# Patient Record
Sex: Male | Born: 1955 | Race: White | Hispanic: No | Marital: Married | Smoking: Never smoker
Health system: Southern US, Community
[De-identification: ages and names within clinical notes are randomized; demographics above are authoritative.]

## PROBLEM LIST (undated history)

## (undated) DIAGNOSIS — E7800 Pure hypercholesterolemia, unspecified: Secondary | ICD-10-CM

## (undated) DIAGNOSIS — J302 Other seasonal allergic rhinitis: Secondary | ICD-10-CM

---

## 2005-01-12 ENCOUNTER — Ambulatory Visit (HOSPITAL_COMMUNITY): Admission: RE | Admit: 2005-01-12 | Discharge: 2005-01-12 | Payer: Self-pay

## 2005-03-23 ENCOUNTER — Encounter (INDEPENDENT_AMBULATORY_CARE_PROVIDER_SITE_OTHER): Payer: Self-pay | Admitting: *Deleted

## 2005-03-23 ENCOUNTER — Ambulatory Visit (HOSPITAL_COMMUNITY): Admission: RE | Admit: 2005-03-23 | Discharge: 2005-03-23 | Payer: Self-pay | Admitting: Vascular Surgery

## 2005-03-23 IMAGING — CR DG CHEST 2V
2 series · 2 of 2 positions shown · non-contrast
Comparison: None.

CLINICAL DATA: 49-year-old with varicose veins.  Preop respiratory exam.  
 CHEST - 2 VIEW:

[view not recorded (1 of 2)]
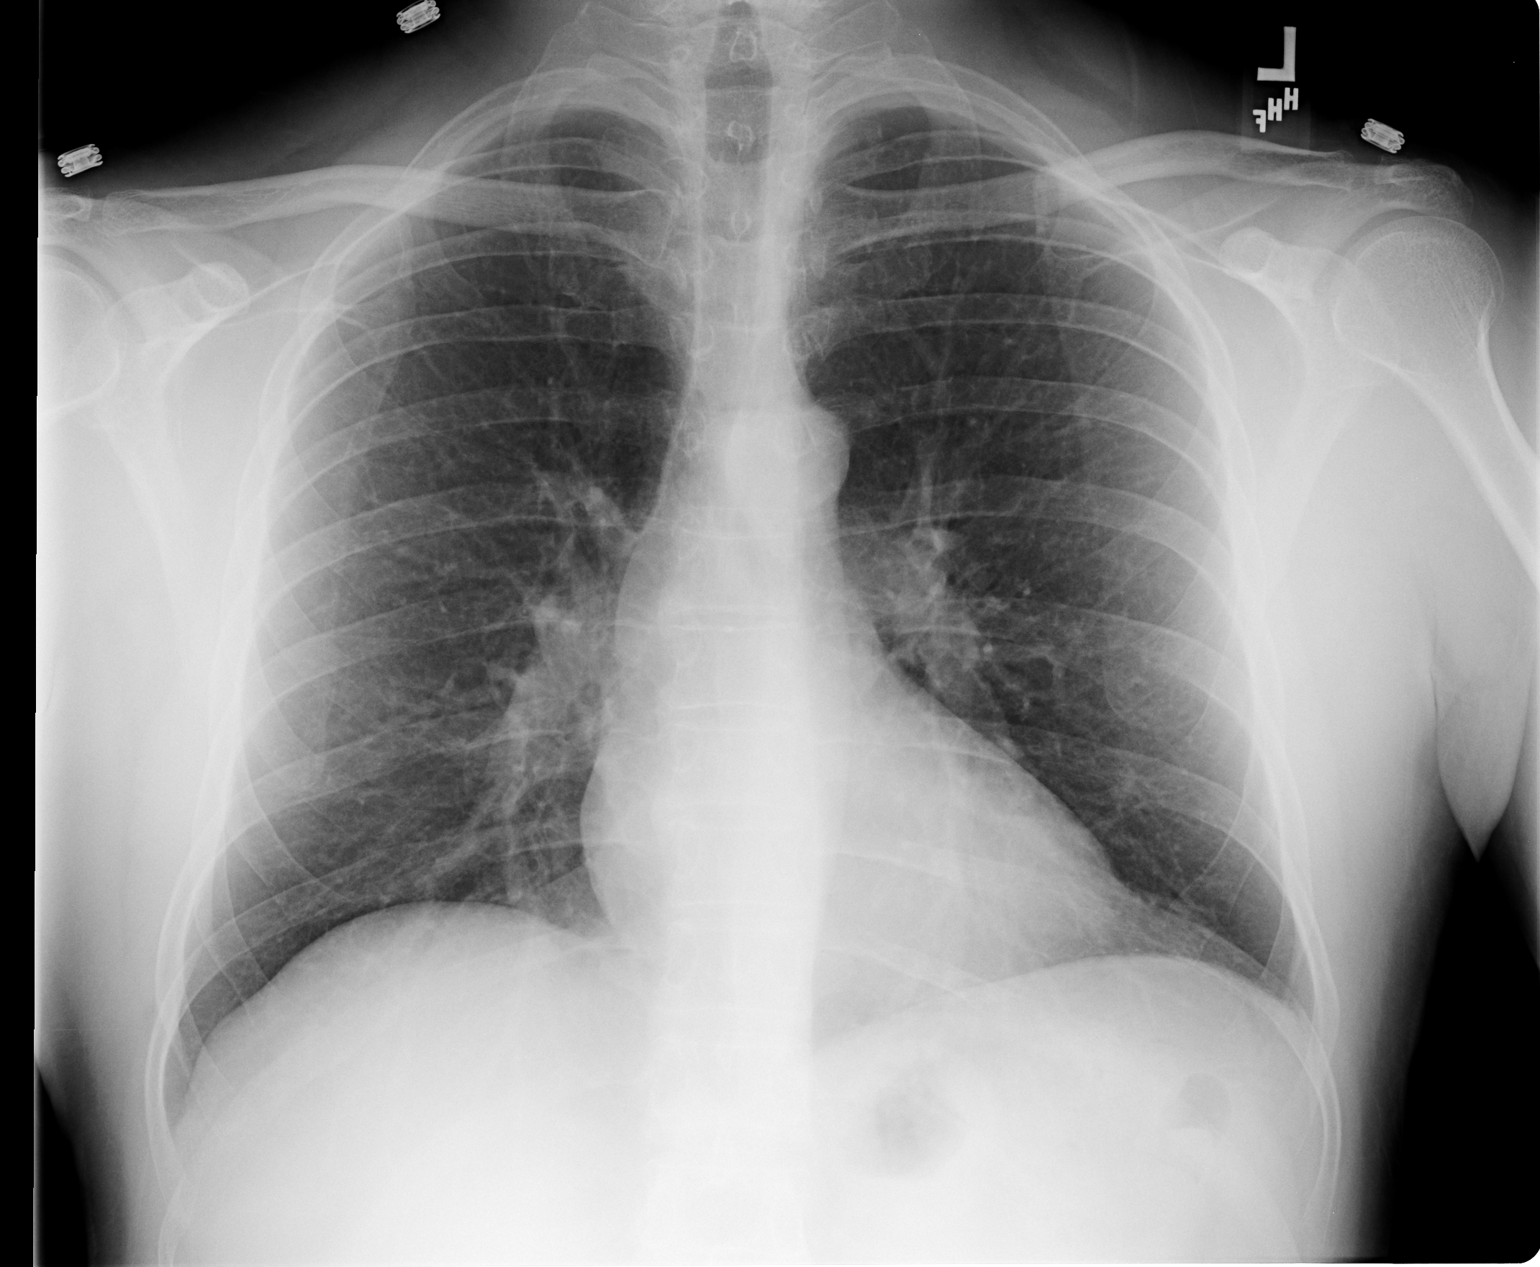

[view not recorded (2 of 2)]
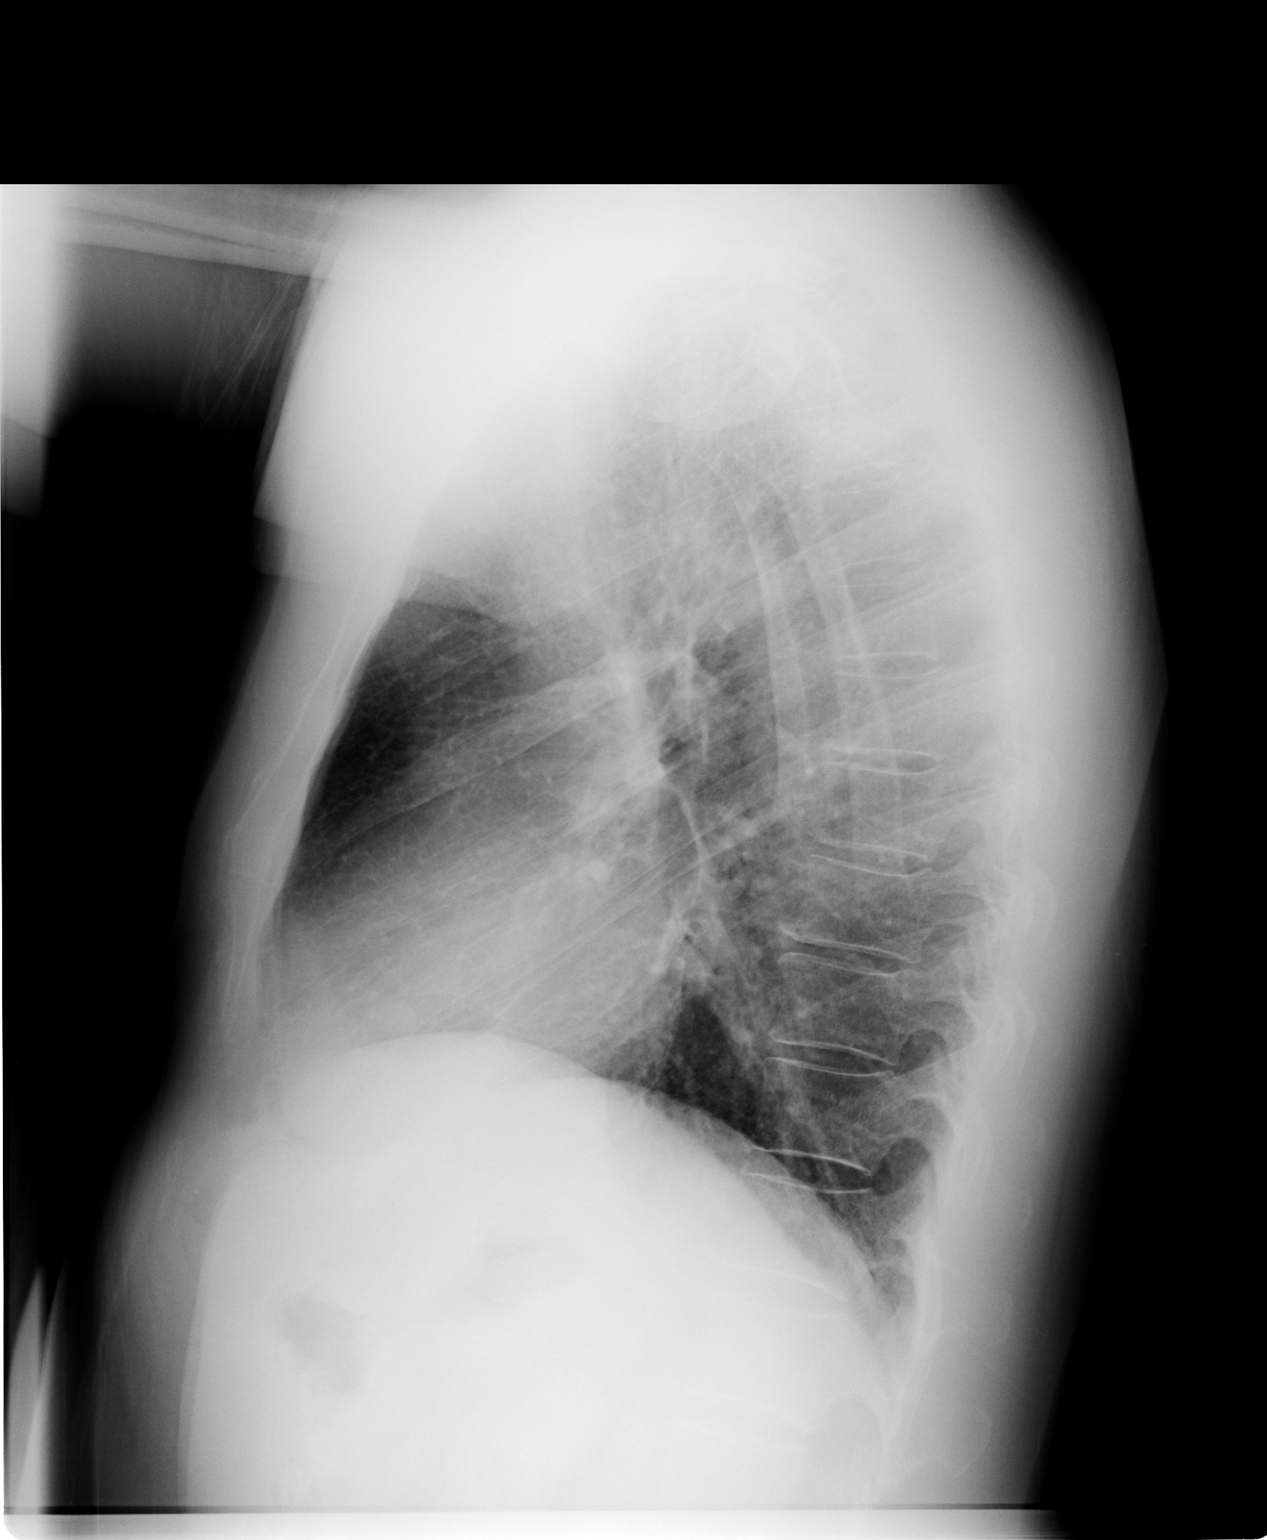

[2 of 2 positions shown; findings below may reference images not displayed]

The heart size and mediastinal contours are within normal limits.  Both lungs are clear.  The visualized skeletal structures are unremarkable.
IMPRESSION: No active cardiopulmonary disease.

## 2005-03-29 ENCOUNTER — Inpatient Hospital Stay (HOSPITAL_COMMUNITY): Admission: AD | Admit: 2005-03-29 | Discharge: 2005-03-30 | Payer: Self-pay | Admitting: Vascular Surgery

## 2005-03-30 IMAGING — CT CT ANGIO CHEST
1 of 3 series · 19 of 32 positions shown · IV contrast (100 ML OMNI 300)
Comparison: None.

CLINICAL DATA: Chest pain/short of breath/recent vein surgery.
 CT ANGIOGRAPHY OF CHEST WITH CONTRAST:
TECHNIQUE: Multidetector CT imaging of the chest was performed before and during bolus injection of intravenous contrast.  Multiplanar CT angiographic image reconstructions were generated to evaluate the vascular anatomy.
 Contrast:  100 cc of Omnipaque 300 IV.

[Series 2: pe · axial · 0.68mm/px · z∈[-278,-16]mm · 19 of 468 slices shown]
[im 24/468  lung]
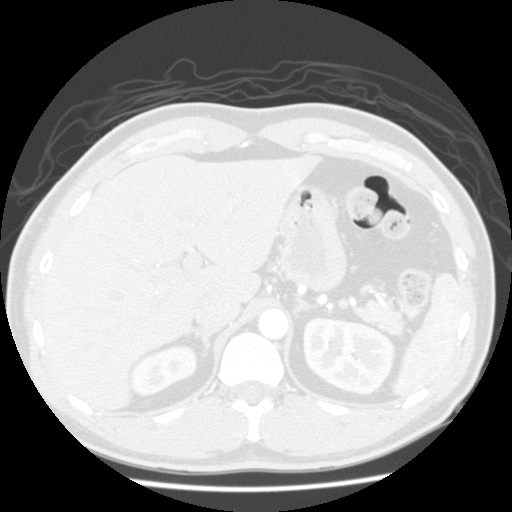
[im 47/468  mediastinal]
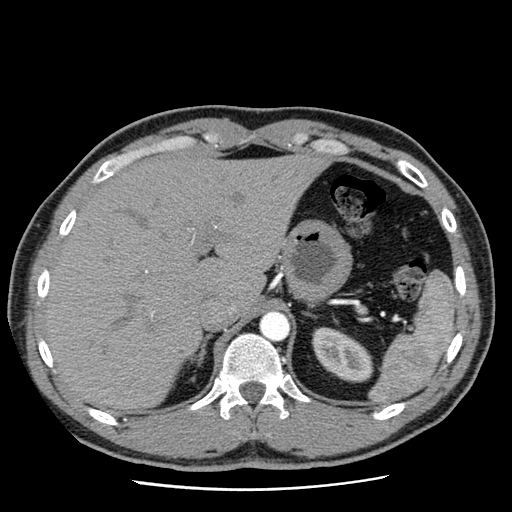
[im 71/468  lung]
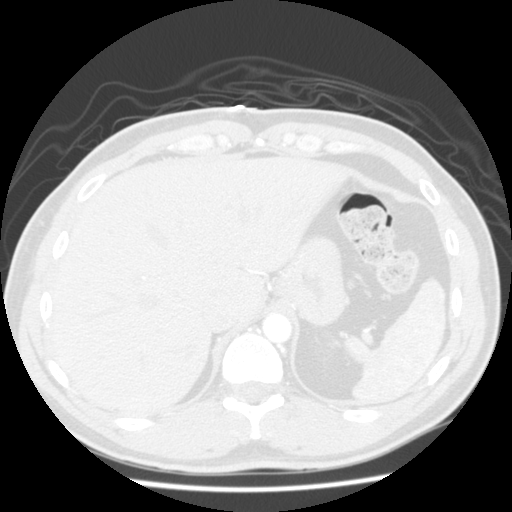
[im 117/468  mediastinal]
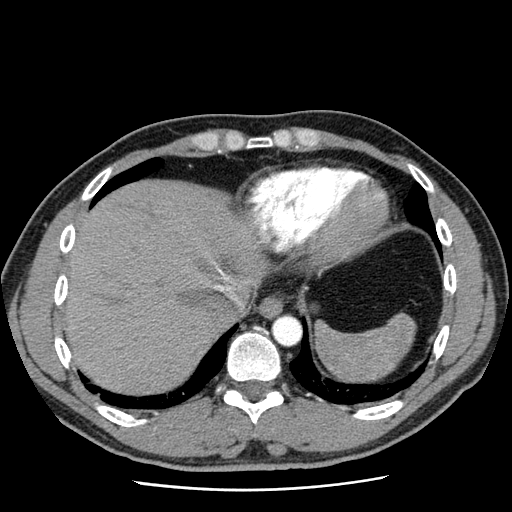
[im 141/468  lung]
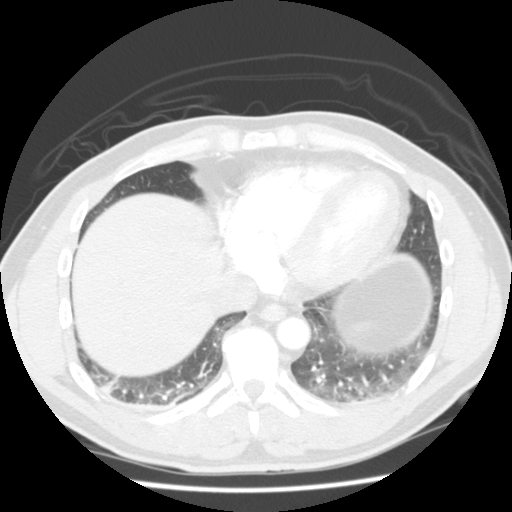
[im 156/468  mediastinal]
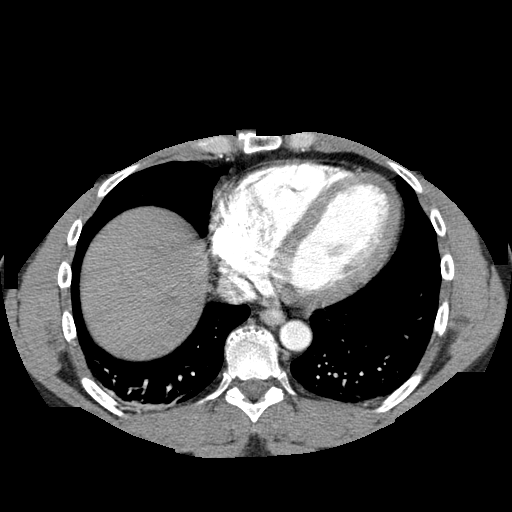
[im 164/468  lung]
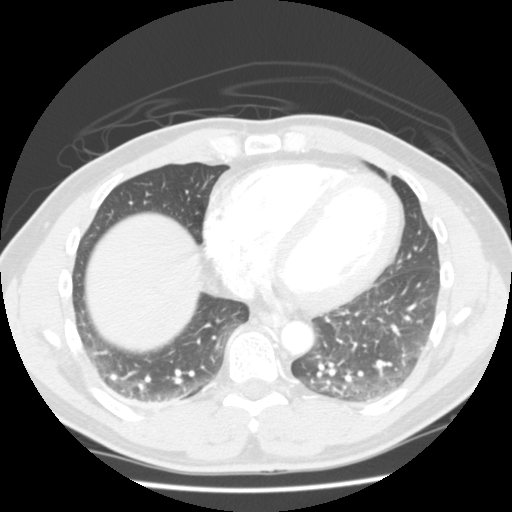
[im 187/468  mediastinal]
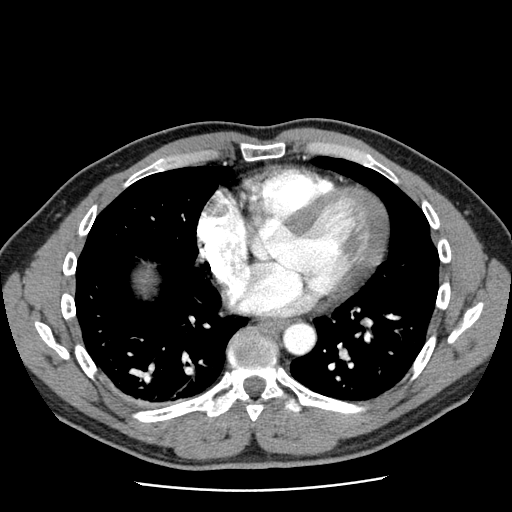
[im 211/468  lung]
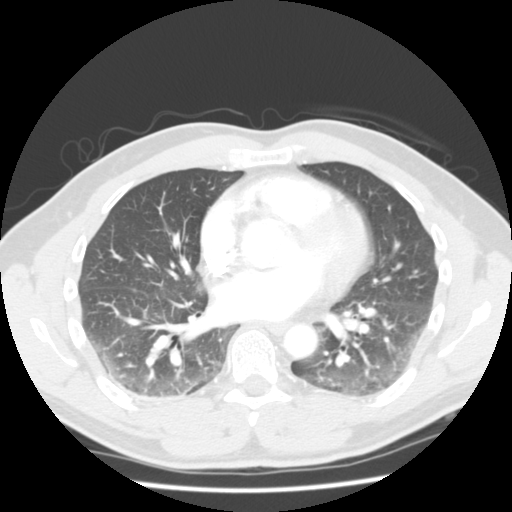
[im 234/468  mediastinal]
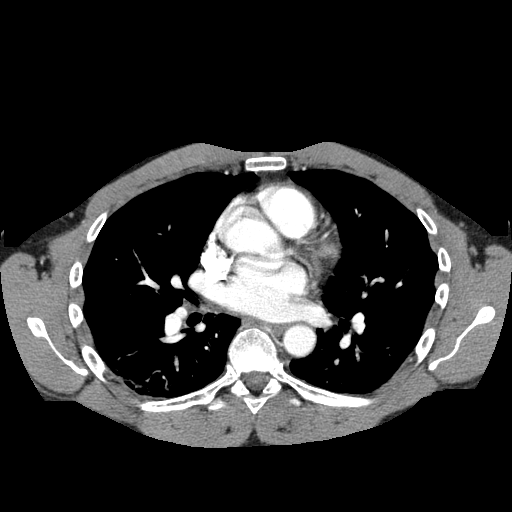
[im 257/468  lung]
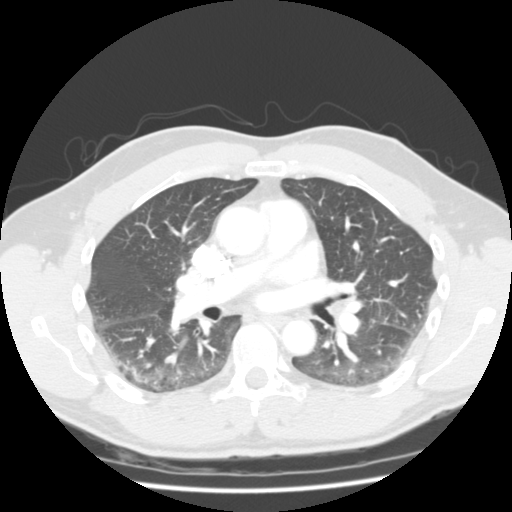
[im 281/468  mediastinal]
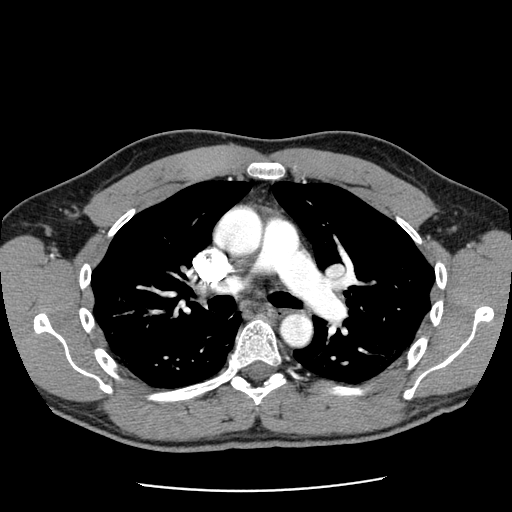
[im 304/468  lung]
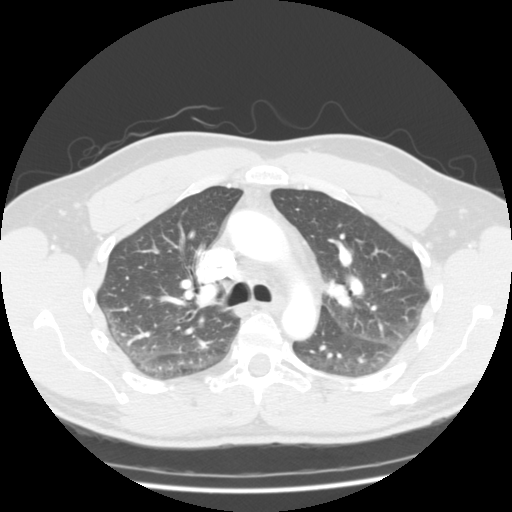
[im 312/468  mediastinal]
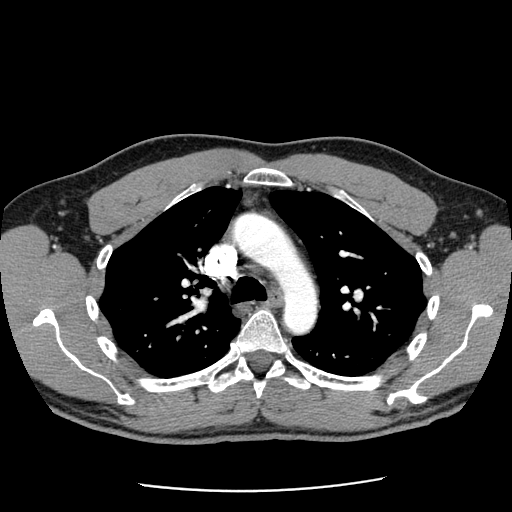
[im 327/468  lung]
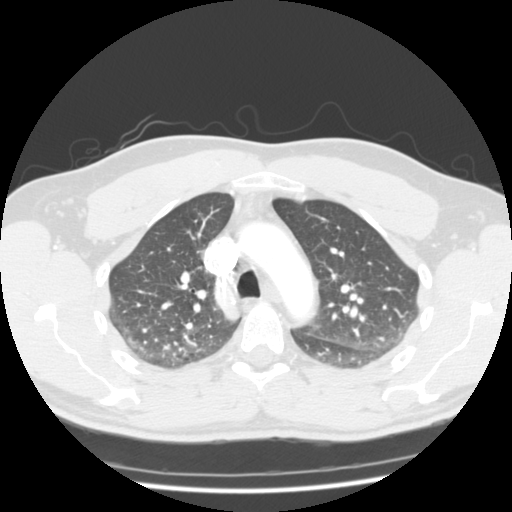
[im 351/468  mediastinal]
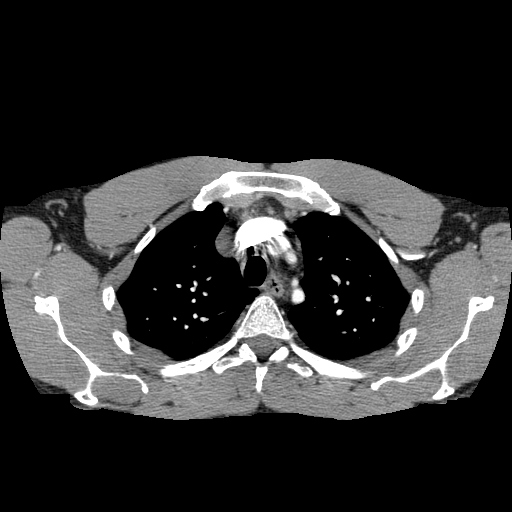
[im 397/468  lung]
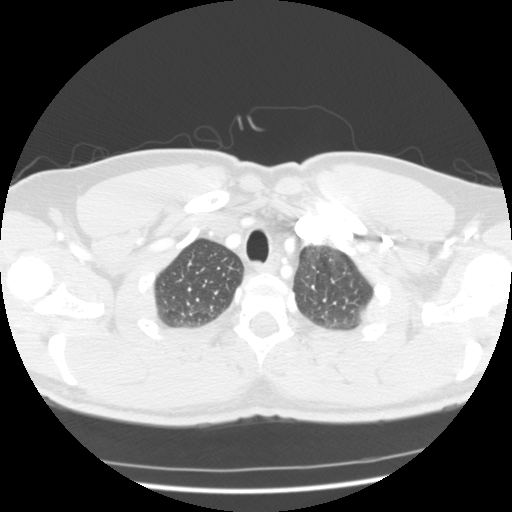
[im 421/468  mediastinal]
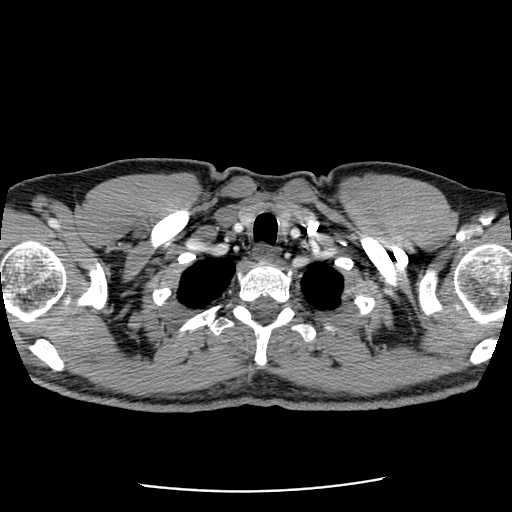
[im 444/468  lung]
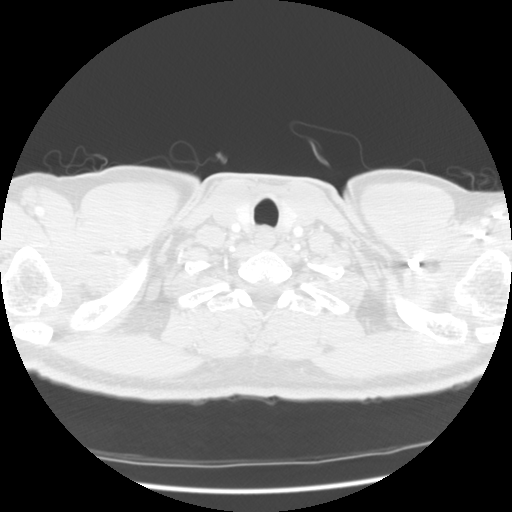

[19 of 32 positions shown; findings below may reference images not displayed]

FINDINGS: In three planes, no definite pulmonary emboli are identified.  The lungs show some scattered blebs and some minimal dependent edema posteriorly, but no active pneumonia, pneumothorax, or nodules.  No adenopathy.  No pleural or pericardial fluid.  There is no evidence for aortic dissection or fusiform aneurysm.
IMPRESSION: 1.  No evidence for pulmonary emboli.
 2.  No active cardiopulmonary disease.
 3.  Aorta normal.

## 2008-10-02 ENCOUNTER — Encounter: Admission: RE | Admit: 2008-10-02 | Discharge: 2008-10-02 | Payer: Self-pay | Admitting: Urology

## 2008-10-02 IMAGING — US US SCROTUM
1 series · 14 of 25 positions shown · non-contrast
Comparison: None available

CLINICAL DATA: Right testicular mass, the patient reports left
testicle larger than right since [DATE].  No discrete
palpable area.

ULTRASOUND OF SCROTUM
TECHNIQUE: Complete ultrasound examination of the testicles,
epididymis, and other scrotal structures was performed.

[Series 1: us scrotum · 0.08mm/px · 14 of 47 slices shown]
[im 1/47]
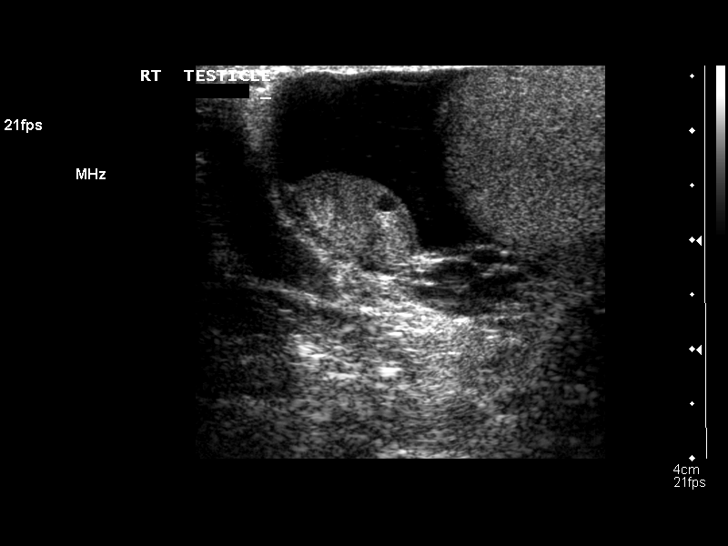
[im 4/47]
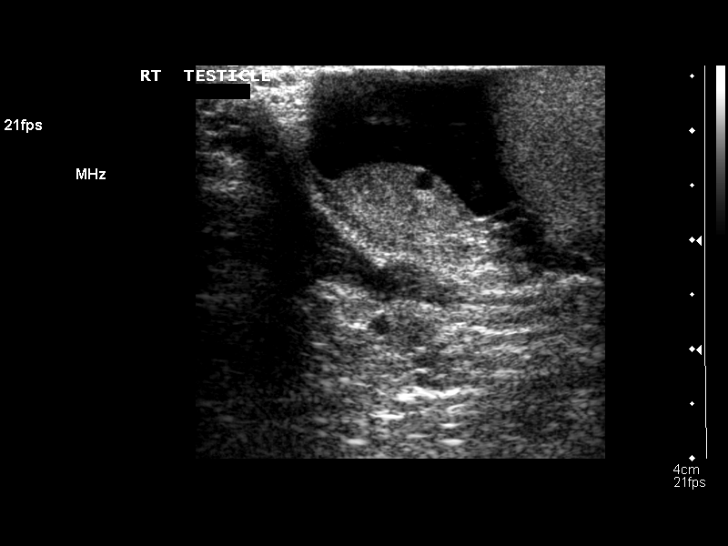
[im 8/47]
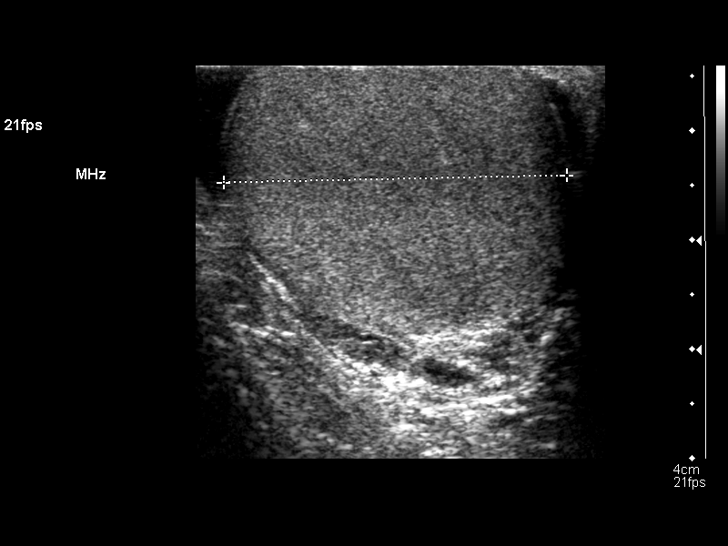
[im 12/47]
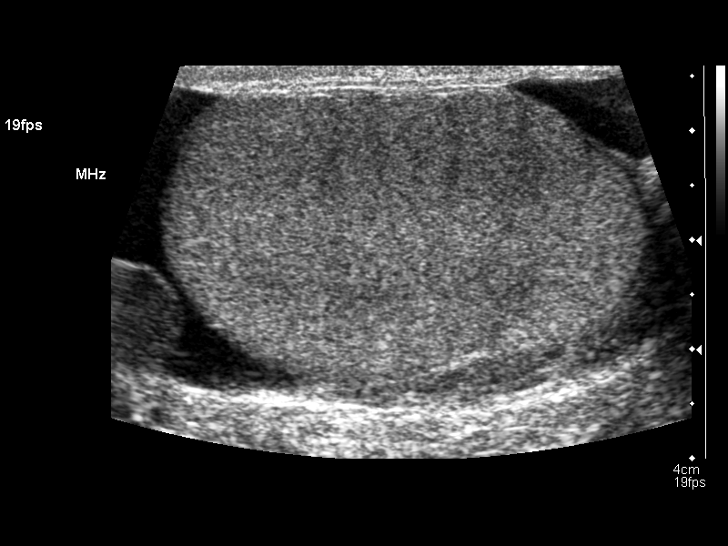
[im 16/47]
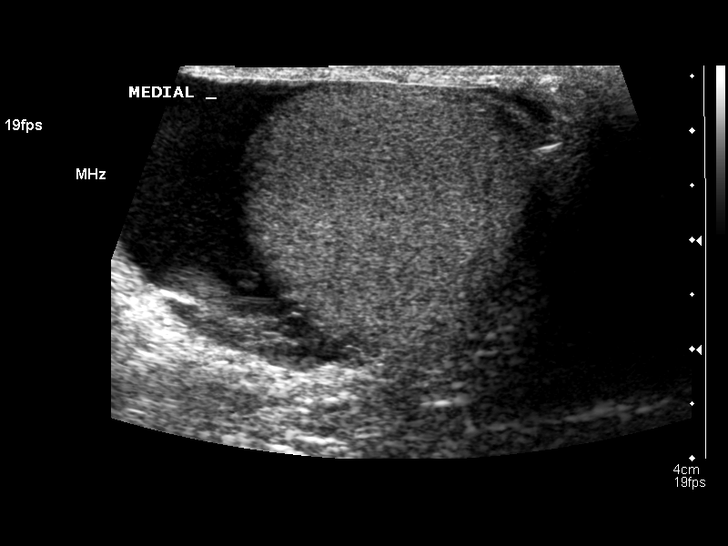
[im 18/47]
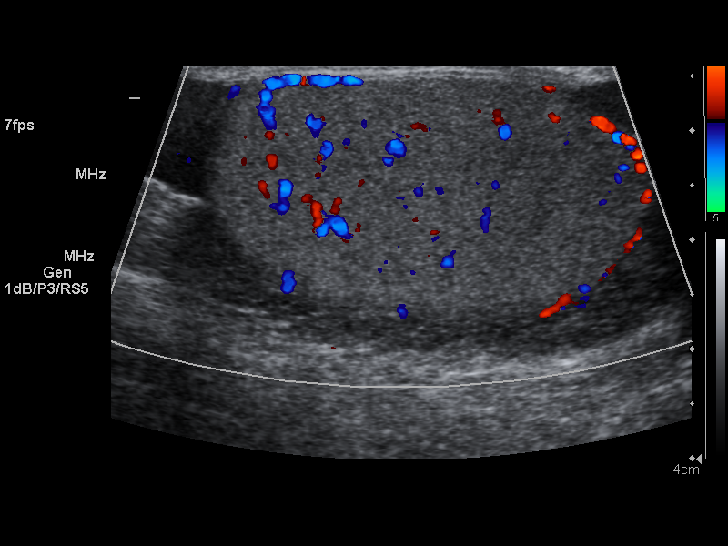
[im 22/47]
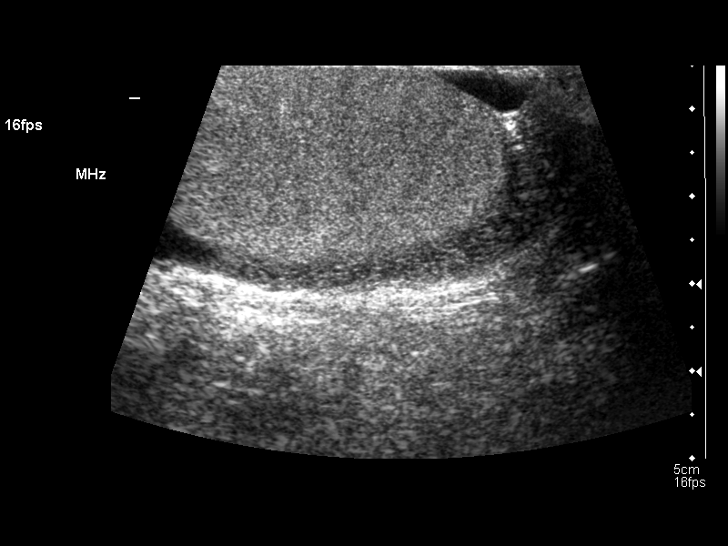
[im 25/47]
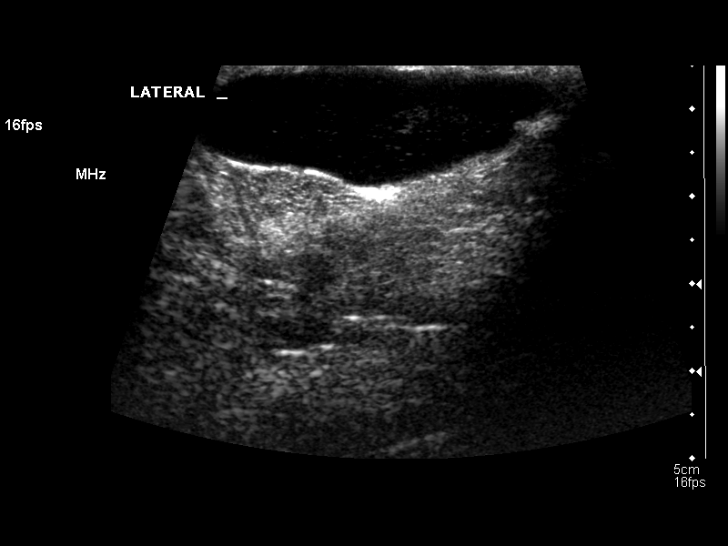
[im 29/47]
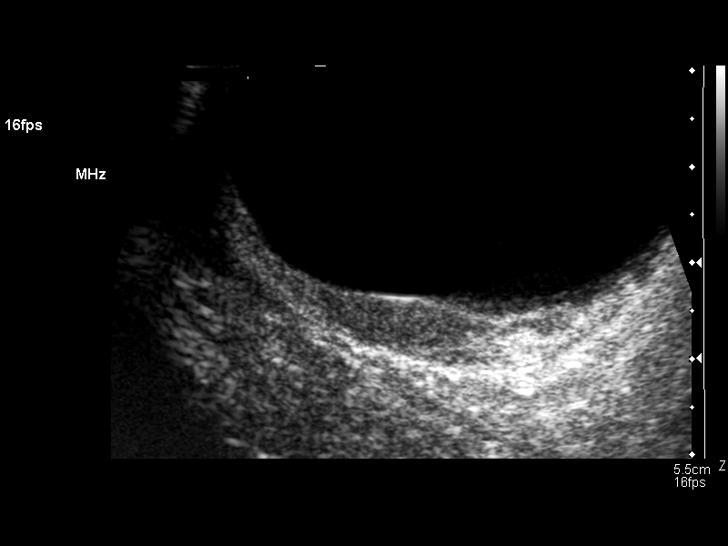
[im 31/47]
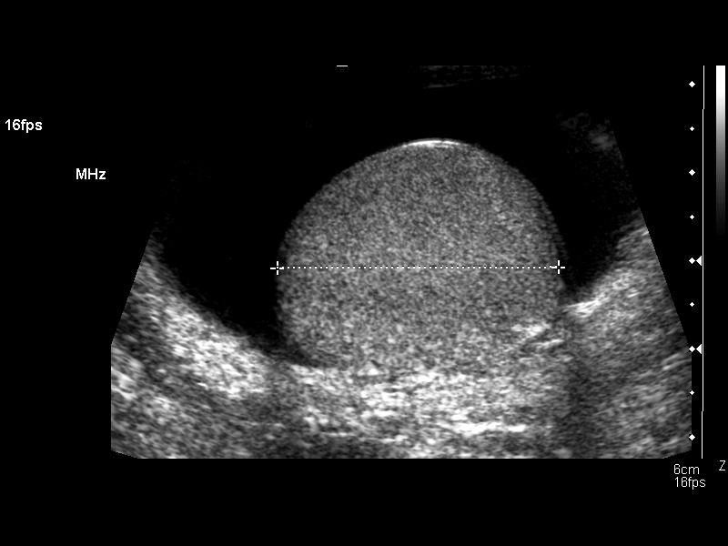
[im 35/47]
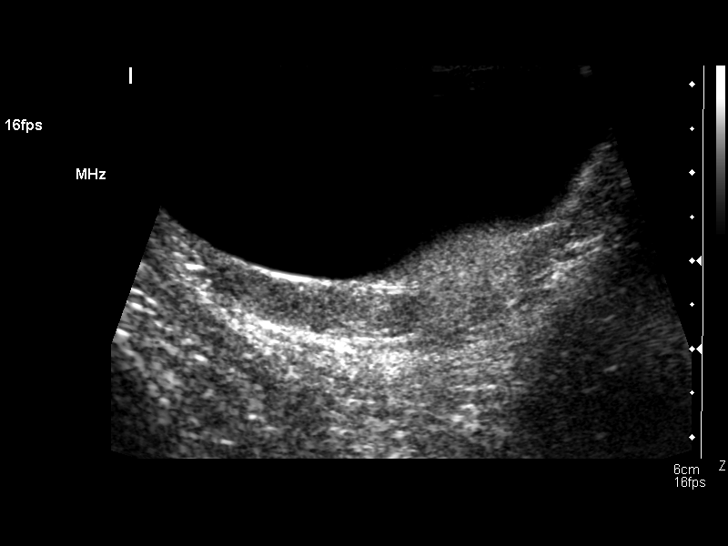
[im 39/47]
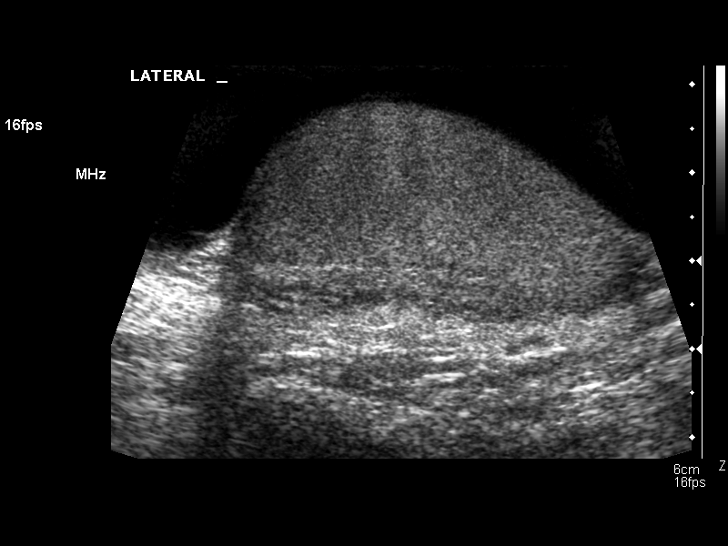
[im 43/47]
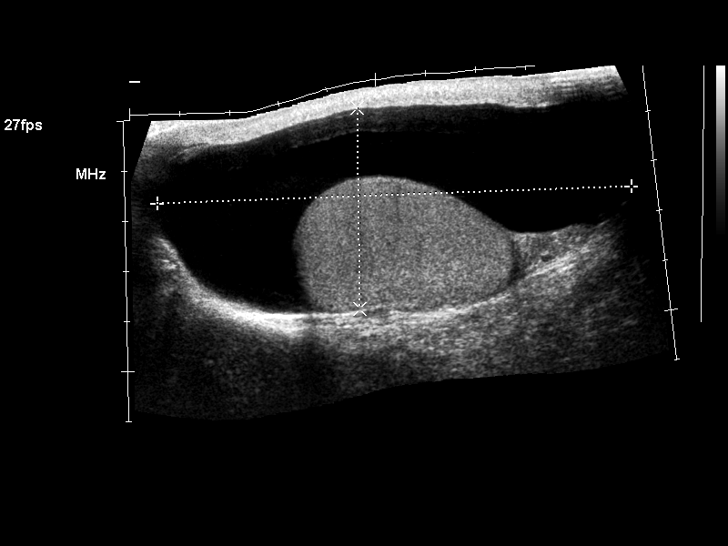
[im 47/47]
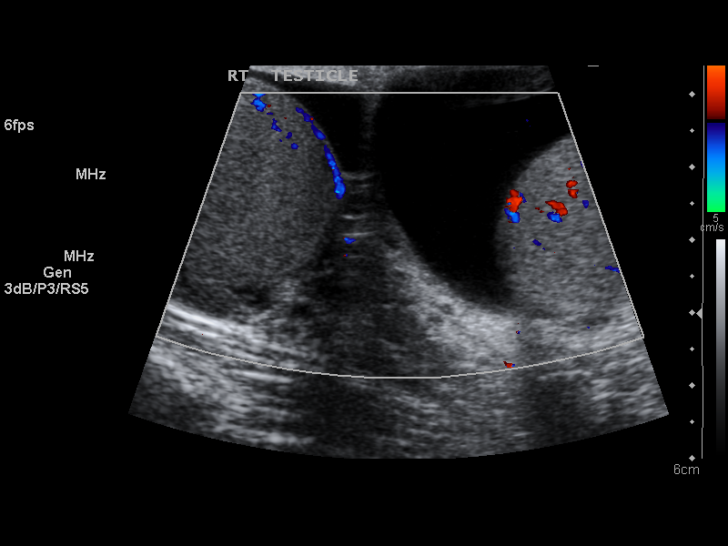

[14 of 25 positions shown; findings below may reference images not displayed]

FINDINGS: Right testicle:  The right testicle is normal in size and
homogeneous in echo texture measuring 4.5 x 2.6 x 3.1 cm.  There is
normal color Doppler flow.  The right epididymis appears normal.
There is a tiny 2 mm right epididymal cyst.

Left testicle is normal in size and homogeneous in echo texture
measuring 4.7 x 2.6 x 3.2 cm.  There is normal color Doppler flow.
The left epididymis is normal.

There are bilateral hydroceles, left greater than right.  These are
anechoic and appear simple.

No evidence of varicoceles.
IMPRESSION: 1.  Normal testicles.
2.  Bilateral simple hydroceles, left greater than right.

## 2008-12-19 ENCOUNTER — Ambulatory Visit (HOSPITAL_COMMUNITY): Admission: RE | Admit: 2008-12-19 | Discharge: 2008-12-19 | Payer: Self-pay | Admitting: Urology

## 2010-08-09 ENCOUNTER — Encounter: Payer: Self-pay | Admitting: Urology

## 2010-10-26 LAB — HEMOGLOBIN AND HEMATOCRIT, BLOOD
HCT: 48.5 % (ref 39.0–52.0)
Hemoglobin: 16.1 g/dL (ref 13.0–17.0)

## 2010-12-01 NOTE — Op Note (Signed)
NAMEDOYT, CASTELLANA NO.:  [id number]   MEDICAL RECORD NO.:  [id number]          PATIENT TYPE:  AMB   LOCATION:  DAY                          FACILITY:  [facility]   PHYSICIAN:  Mark C. Vernie Ammons, M.D.  DATE OF BIRTH:  03/30/1956   DATE OF PROCEDURE:  12/19/2008  DATE OF DISCHARGE:                               OPERATIVE REPORT   PREOPERATIVE DIAGNOSIS:  Bilateral hydroceles (left greater than right).   POSTOPERATIVE DIAGNOSIS:  Bilateral hydroceles (left greater than  right).   PROCEDURE:  Bilateral hydrocelectomy.   SURGEON:  Dr. Vernie Ammons.   ANESTHESIA:  General with local supplement.   SPECIMENS:  None.   BLOOD LOSS:  Minimal.   DRAINS:  None.   COMPLICATIONS:  None.   INDICATIONS:  The patient is a 55 year old male with a large, tense left  hydrocele that has increased in size and has been causing pain.  A  scrotal ultrasound was performed that revealed bilateral hydroceles with  the left side being much larger than the right and no testicular  pathology.  The procedure of hydrocelectomy was discussed with the  patient, as well as the alternatives, risks and complications.  The  patient understands and has elected to proceed.  He also desired  treatment of the right sided hydrocele simultaneously to prevent this  from occurring on the right-hand side in the future.   DESCRIPTION OF OPERATION:  After informed consent, the patient was  brought to the major OR, placed on table and administered general  anesthesia.  Then, his genitalia were sterilely prepped and draped.  An  official timeout was then performed and a midline median raphe scrotal  incision was then made and carried down through the scrotal tissue to  expose the parietal tunica albuginea which was incised, drained of clear  fluid and then dissected from the surrounding tissue.  I then incised  this along its length and exposed the testicle which was noted to be  mildly hyperemic but had no  lesions.  The epididymis also appeared to  have mild to moderate hyperemia, but again no specific masses or lesions  were identified and the hydrocele fluid that was drained was completely  clear amber.   I excised the redundant the parietal tunica vaginalis and then  cauterized the edges circumferentially.  I then reapproximated the edges  behind the testicle and epididymis using running locking 3-0 chromic  suture.  This was performed from distal to proximal and I left enough  space to freely allow the cord and epididymis to be free of any pressure  or tension.  I then replaced the left testicle in the left hemiscrotum  and then directed my attention to the right hemiscrotum through the same  incision.  I then opened the parietal tunica in an identical fashion,  drained clear amber hydrocele fluid and because the sac was not  particularly large, I reapproximated the edges behind the testicle and  epididymis in an identical fashion to the left side.  Again, no tension  or pressure was placed on the epididymis or  cord structures at the  posterior of the testicle.  I then replaced the right testicle in the  right hemiscrotum and inspected for any bleeding points.  Small bleeding  points were identified and cauterized with electrocautery.  I then  closed the deep tissue in a running fashion using 4-0 chromic suture  incorporating the septum.  I then injected quarter-percent plain  Marcaine in the subcutaneous tissue and closed the skin with running 4-0  chromic.  Fluffed Kerlix and a scrotal support was applied and the  patient was taken to the recovery room where ice was applied to the  scrotum and he was allowed to recover.   He was given discharge instructions and a prescription for specimen for  Keflex 500 mg t.i.d. #15 and Demerol tabs 50 mg #28 with follow-up in my  office on January 16, 2009.      Mark C. Vernie Ammons, M.D.  Electronically Signed     MCO/MEDQ  D:  12/19/2008  T:   12/19/2008  Job:  [id number]

## 2010-12-04 NOTE — Op Note (Signed)
[name], Bradley Marquez                   ACCOUNT NO.:  [id number]   MEDICAL RECORD NO.:  [id number]          PATIENT TYPE:  AMB   LOCATION:  SDS                          FACILITY:  [facility]   PHYSICIAN:  Larina Earthly, M.D.    DATE OF BIRTH:  [birth year]   DATE OF PROCEDURE:  DATE OF DISCHARGE:                                 OPERATIVE REPORT   PREOPERATIVE DIAGNOSIS:  Severe venous insufficiency of right greater  saphenous vein and marked tributary varicosities with changes of venous  stasis to right ankle.   POSTOPERATIVE DIAGNOSIS:  Severe venous insufficiency of right greater  saphenous vein and marked tributary varicosities with changes of venous  stasis to right ankle.   PROCEDURE:  Ligation and stripping of right greater saphenous vein from the  ankle to the groin with tributary varicosity removal of varicosities in the  thigh, calf and ankle.   SURGEON:  Larina Earthly, M.D.   ASSISTANT:  Nurse.   ANESTHESIA:  General endotracheal anesthesia.   COMPLICATIONS:  None.   DISPOSITION:  To recovery room stable.   PROCEDURE IN DETAIL:  The patient was taken to the operating room and placed  in the supine position where the area of the right groin and right leg were  prepped and draped in the usual sterile fashion.  The patient had been  placed in the standing position prior to this and the varicosities were all  marked.  Incision was made at the groin and carried down to isolate the  saphenous vein at the saphenofemoral junction.  The patient had a very large  saphenous vein which was 2 cm in diameter at the saphenofemoral femoral  junction.  Tributary branches of this were ligated and divided.   Separate incision was made at the ankle and the saphenous vein was exposed  through a small incision and was ligated distally.  The vein was controlled  proximally with the Potts tie, and was opened with an 11-blade and a  stripper was passed centrally.  The stripper was held up in a very  large  nest of varicosities in the medial thigh.  A separate incision was made over  this area and the stripper was directly pass through this site up to the  groin.  The saphenous vein was doubly ligated at the saphenofemoral junction  with 2-0 silk ties. The vein was controlled distally with Potts tie and  stripper was brought out through the vein near the saphenofemoral junction.   Next, using a stab-avulsion technique tributary varicosities were removed  from the calf and from the ankle.  Also, the patient had a very large nest  of varicosities in the medial thigh and these were all removed with  avulsion.  Finally the patient was placed in Trendelenburg position and the  saphenous vein was removed with the vein stripper and pressure was held for  hemostasis.  The stab incisions were closed with 4-0 subcuticular Vicryl  stitches.  The  ankle incision was closed with interrupted 4-0 subcuticular Vicryl stitches  and the groin incision was closed  with 3-0 Vicryl in the subcutaneous  tissue, subcuticular tissue. Benzoin and Steri-Strips were applied.  A  Kerlix and Coban pressure dressing was applied.  The patient was transferred  to the recovery room in stable condition.      Larina Earthly, M.D.  Electronically Signed     TFE/MEDQ  D:  03/23/2005  T:  03/23/2005  Job:  [id number]

## 2020-04-17 ENCOUNTER — Observation Stay (HOSPITAL_COMMUNITY): Payer: No Typology Code available for payment source

## 2020-04-17 ENCOUNTER — Other Ambulatory Visit: Payer: Self-pay

## 2020-04-17 ENCOUNTER — Emergency Department (HOSPITAL_COMMUNITY): Payer: No Typology Code available for payment source

## 2020-04-17 ENCOUNTER — Encounter (HOSPITAL_COMMUNITY): Payer: Self-pay | Admitting: *Deleted

## 2020-04-17 ENCOUNTER — Observation Stay (HOSPITAL_COMMUNITY)
Admission: EM | Admit: 2020-04-17 | Discharge: 2020-04-18 | Disposition: A | Discharge status: Home / Self Care | Payer: No Typology Code available for payment source | Attending: Family Medicine | Admitting: Family Medicine

## 2020-04-17 DIAGNOSIS — I10 Essential (primary) hypertension: Secondary | ICD-10-CM | POA: Diagnosis not present

## 2020-04-17 DIAGNOSIS — Z23 Encounter for immunization: Secondary | ICD-10-CM | POA: Insufficient documentation

## 2020-04-17 DIAGNOSIS — R413 Other amnesia: Secondary | ICD-10-CM | POA: Diagnosis present

## 2020-04-17 DIAGNOSIS — G9341 Metabolic encephalopathy: Secondary | ICD-10-CM

## 2020-04-17 DIAGNOSIS — E782 Mixed hyperlipidemia: Secondary | ICD-10-CM | POA: Insufficient documentation

## 2020-04-17 DIAGNOSIS — F329 Major depressive disorder, single episode, unspecified: Secondary | ICD-10-CM | POA: Insufficient documentation

## 2020-04-17 DIAGNOSIS — R401 Stupor: Secondary | ICD-10-CM | POA: Diagnosis not present

## 2020-04-17 DIAGNOSIS — Z79899 Other long term (current) drug therapy: Secondary | ICD-10-CM | POA: Diagnosis not present

## 2020-04-17 DIAGNOSIS — Z7951 Long term (current) use of inhaled steroids: Secondary | ICD-10-CM | POA: Insufficient documentation

## 2020-04-17 DIAGNOSIS — R011 Cardiac murmur, unspecified: Secondary | ICD-10-CM | POA: Diagnosis not present

## 2020-04-17 DIAGNOSIS — G454 Transient global amnesia: Principal | ICD-10-CM | POA: Insufficient documentation

## 2020-04-17 DIAGNOSIS — Z20822 Contact with and (suspected) exposure to covid-19: Secondary | ICD-10-CM | POA: Insufficient documentation

## 2020-04-17 DIAGNOSIS — R410 Disorientation, unspecified: Secondary | ICD-10-CM | POA: Diagnosis present

## 2020-04-17 HISTORY — DX: Other seasonal allergic rhinitis: J30.2

## 2020-04-17 HISTORY — DX: Pure hypercholesterolemia, unspecified: E78.00

## 2020-04-17 LAB — COMPREHENSIVE METABOLIC PANEL
ALT: 44 U/L (ref 0–44)
AST: 33 U/L (ref 15–41)
Albumin: 4.5 g/dL (ref 3.5–5.0)
Alkaline Phosphatase: 98 U/L (ref 38–126)
Anion gap: 11 (ref 5–15)
BUN: 11 mg/dL (ref 8–23)
CO2: 26 mmol/L (ref 22–32)
Calcium: 9.7 mg/dL (ref 8.9–10.3)
Chloride: 103 mmol/L (ref 98–111)
Creatinine, Ser: 0.75 mg/dL (ref 0.61–1.24)
GFR calc Af Amer: >60 mL/min (ref >60)
GFR calc non Af Amer: >60 mL/min (ref >60)
Glucose, Bld: 109 mg/dL — ABNORMAL HIGH (ref 70–99)
Potassium: 3.8 mmol/L (ref 3.5–5.1)
Sodium: 140 mmol/L (ref 135–145)
Total Bilirubin: 0.9 mg/dL (ref 0.3–1.2)
Total Protein: 7.5 g/dL (ref 6.5–8.1)

## 2020-04-17 LAB — CBC
HCT: 51.4 % (ref 39.0–52.0)
Hemoglobin: 16.9 g/dL (ref 13.0–17.0)
MCH: 32.8 pg (ref 26.0–34.0)
MCHC: 32.9 g/dL (ref 30.0–36.0)
MCV: 99.6 fL (ref 80.0–100.0)
Platelets: 260 10*3/uL (ref 150–400)
RBC: 5.16 MIL/uL (ref 4.22–5.81)
RDW: 13.2 % (ref 11.5–15.5)
WBC: 7.3 10*3/uL (ref 4.0–10.5)
nRBC: 0 % (ref 0.0–0.2)

## 2020-04-17 LAB — RESPIRATORY PANEL BY RT PCR (FLU A&B, COVID)
Influenza A by PCR: NEGATIVE
Influenza B by PCR: NEGATIVE
SARS Coronavirus 2 by RT PCR: NEGATIVE

## 2020-04-17 LAB — RAPID URINE DRUG SCREEN, HOSP PERFORMED
Amphetamines: NOT DETECTED
Barbiturates: NOT DETECTED
Benzodiazepines: NOT DETECTED
Cocaine: NOT DETECTED
Opiates: NOT DETECTED
Tetrahydrocannabinol: NOT DETECTED

## 2020-04-17 LAB — TSH: TSH: 3.473 u[IU]/mL (ref 0.350–4.500)

## 2020-04-17 LAB — ACETAMINOPHEN LEVEL: Acetaminophen (Tylenol), Serum: <10 ug/mL — ABNORMAL LOW (ref 10–30)

## 2020-04-17 LAB — ETHANOL: Alcohol, Ethyl (B): <10 mg/dL (ref <10)

## 2020-04-17 LAB — DIFFERENTIAL
Abs Immature Granulocytes: 0.05 10*3/uL (ref 0.00–0.07)
Basophils Absolute: 0.1 10*3/uL (ref 0.0–0.1)
Basophils Relative: 1 %
Eosinophils Absolute: 0.2 10*3/uL (ref 0.0–0.5)
Eosinophils Relative: 3 %
Immature Granulocytes: 1 %
Lymphocytes Relative: 22 %
Lymphs Abs: 1.6 10*3/uL (ref 0.7–4.0)
Monocytes Absolute: 0.6 10*3/uL (ref 0.1–1.0)
Monocytes Relative: 8 %
Neutro Abs: 4.8 10*3/uL (ref 1.7–7.7)
Neutrophils Relative %: 65 %

## 2020-04-17 LAB — PROTIME-INR
INR: 0.9 (ref 0.8–1.2)
Prothrombin Time: 11.7 seconds (ref 11.4–15.2)

## 2020-04-17 LAB — SALICYLATE LEVEL: Salicylate Lvl: <7 mg/dL — ABNORMAL LOW (ref 7.0–30.0)

## 2020-04-17 LAB — APTT: aPTT: 27 seconds (ref 24–36)

## 2020-04-17 IMAGING — MR MR MRA NECK WO/W CM
8 of 11 series · 42 of 48 positions shown · IV contrast (Gadavist)
Comparison: Head CT [DATE]

CLINICAL DATA: Transient memory loss.

EXAM:
MR HEAD WITHOUT CONTRAST
MR CIRCLE OF WILLIS WITHOUT CONTRAST
MRA OF THE NECK WITHOUT AND WITH CONTRAST
TECHNIQUE: Multiplanar, multiecho pulse sequences of the brain, circle of
willis and surrounding structures were obtained without intravenous
contrast. Angiographic images of the neck were obtained using MRA
technique without and with intravenous contrast.
CONTRAST:  8.6mL GADAVIST GADOBUTROL 1 MMOL/ML IV SOLN

[Series 6: tof_fl3d_tra_iso · axial · 0.7mm · 0.52mm/px · z∈[-258,-109]mm · 9 of 214 slices shown]
[im 1/214]
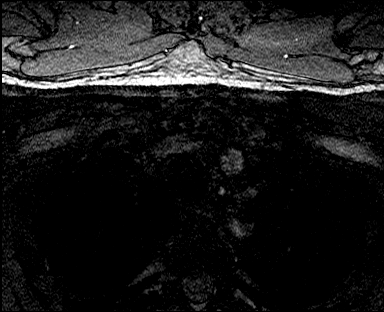
[im 39/214]
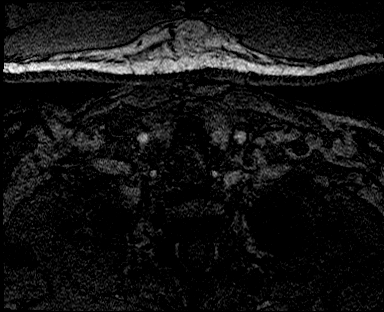
[im 59/214]
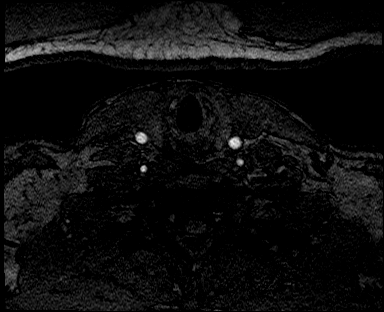
[im 97/214]
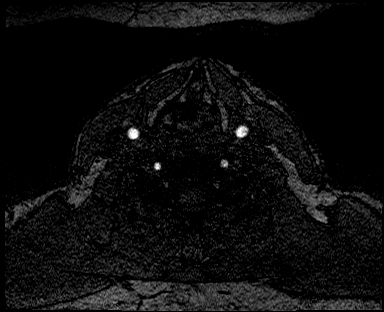
[im 117/214]
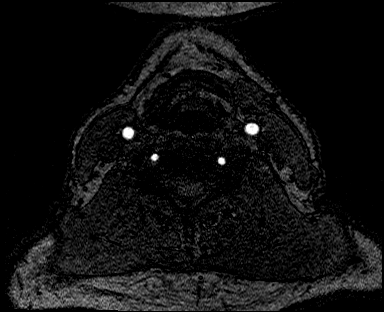
[im 155/214]
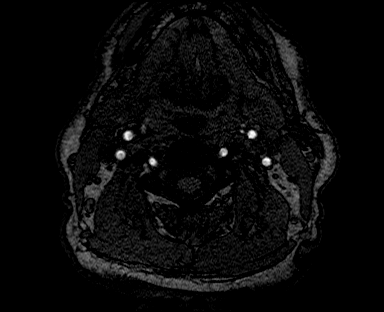
[im 175/214]
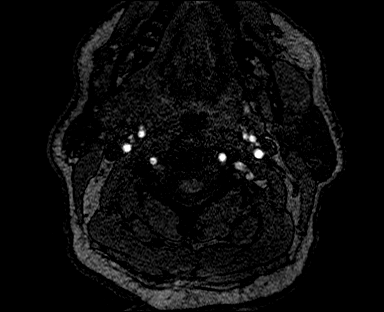
[im 194/214]
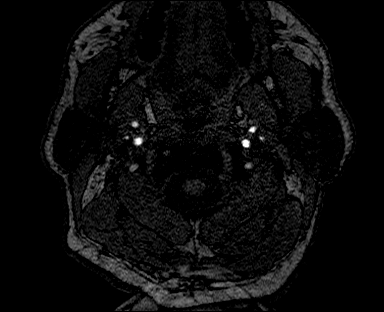
[im 214/214]
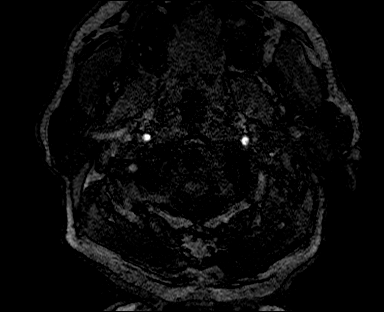

[Series 9: angio_fl3d_cor_pre_ttc=3.0s · coronal · 0.9mm · 0.85mm/px · 4 of 80 slices shown]
[im 1/80]
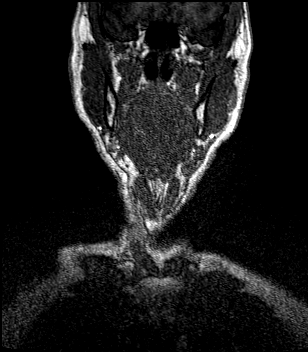
[im 27/80]
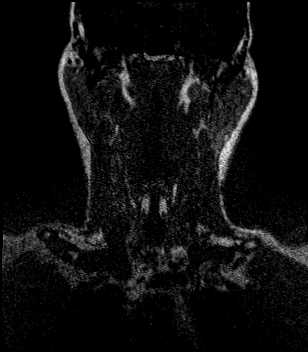
[im 53/80]
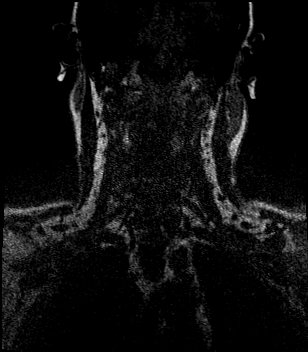
[im 80/80]
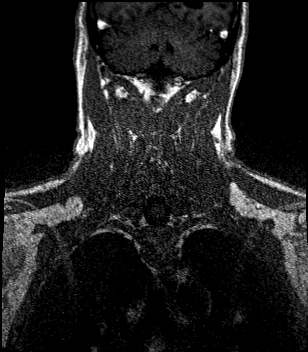

[Series 11: angio_fl3d_cor_post_ttc=3.0s · coronal · 0.9mm · 0.85mm/px · 4 of 80 slices shown (1 of 2)]
[im 1/80]
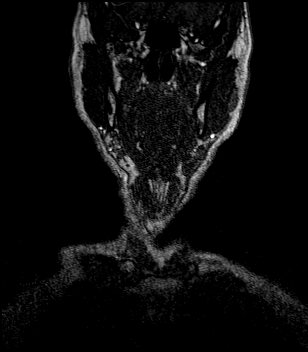
[im 27/80]
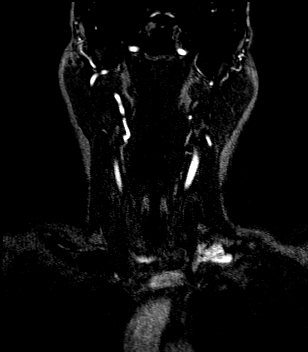
[im 53/80]
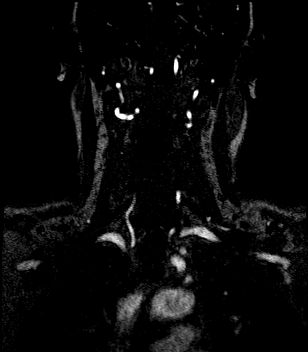
[im 80/80]
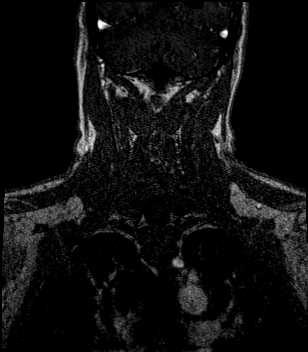

[Series 12: angio_fl3d_cor_post_ttc=3.0s_moco-adv · coronal · 0.9mm · 0.85mm/px · 5 of 79 slices shown (1 of 2)]
[im 1/79]
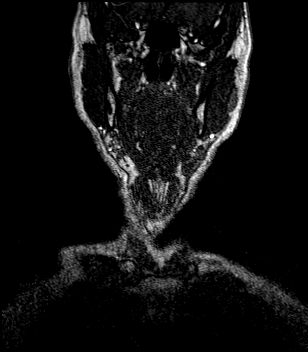
[im 20/79]
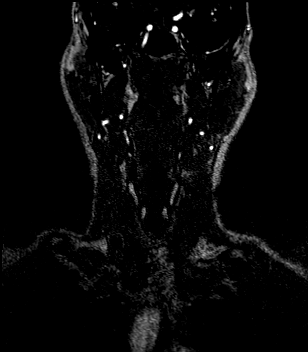
[im 40/79]
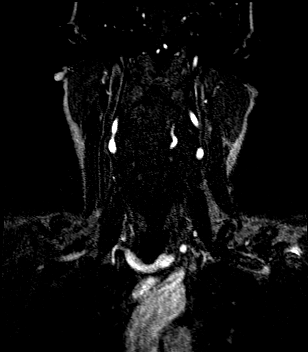
[im 59/79]
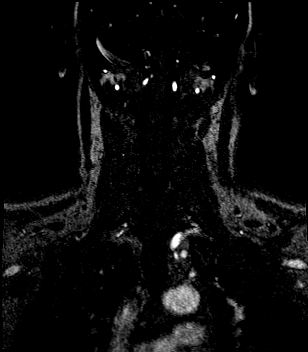
[im 79/79]
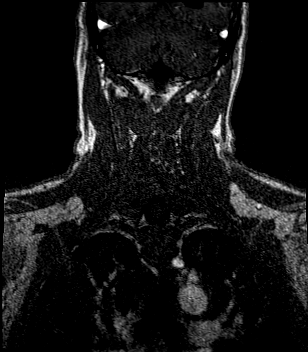

[Series 13: angio_fl3d_cor_post_ttc=3.0s_moco-adv_sub · coronal · 0.9mm · 0.85mm/px · 5 of 80 slices shown (1 of 2)]
[im 1/80]
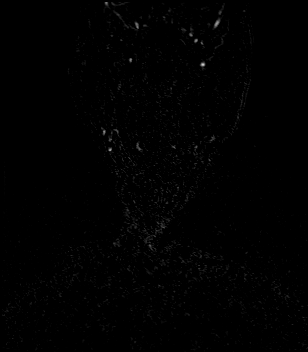
[im 20/80]
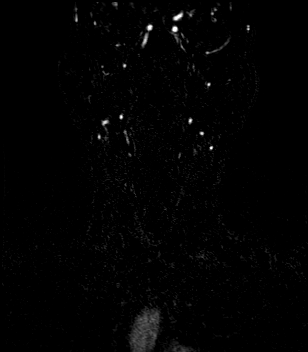
[im 40/80]
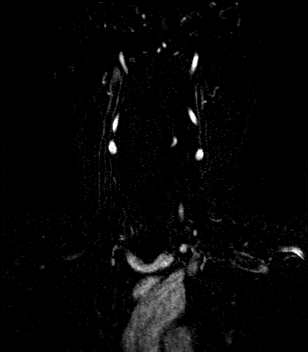
[im 60/80]
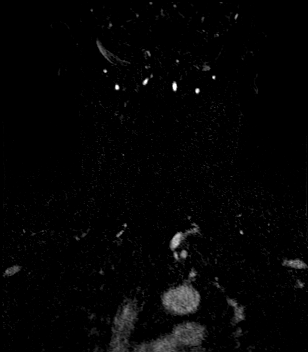
[im 80/80]
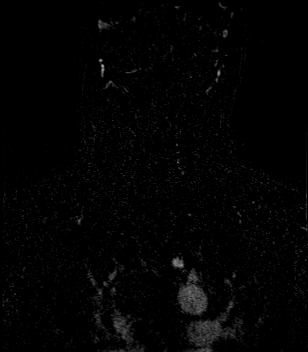

[Series 15: angio_fl3d_cor_post_ttc=3.0s · coronal · 0.9mm · 0.85mm/px · 5 of 80 slices shown (2 of 2)]
[im 1/80]
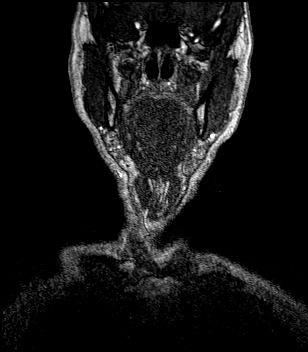
[im 20/80]
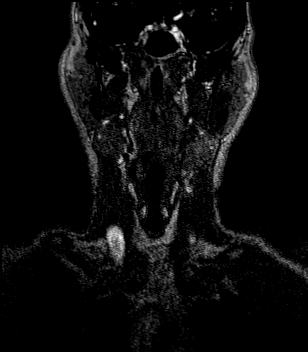
[im 40/80]
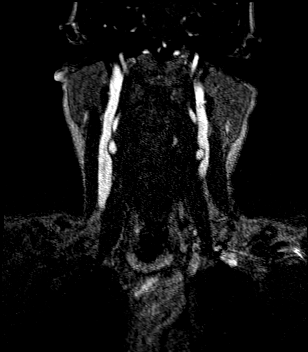
[im 60/80]
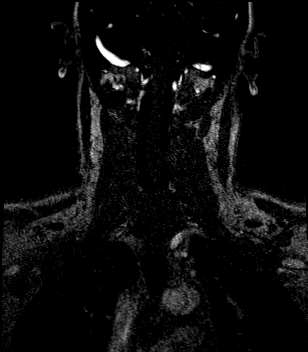
[im 80/80]
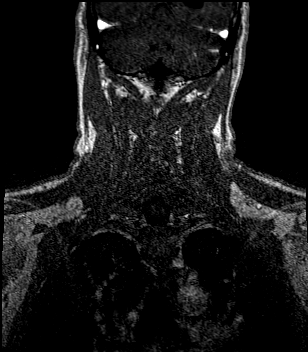

[Series 16: angio_fl3d_cor_post_ttc=3.0s_moco-adv · coronal · 0.9mm · 0.85mm/px · 5 of 80 slices shown (2 of 2)]
[im 1/80]
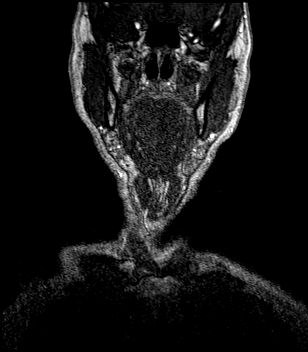
[im 20/80]
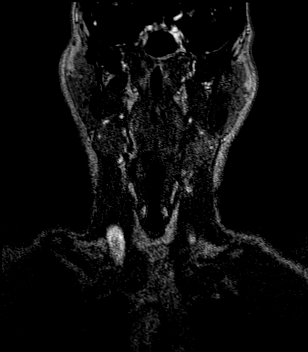
[im 40/80]
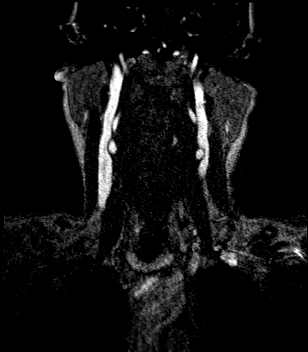
[im 60/80]
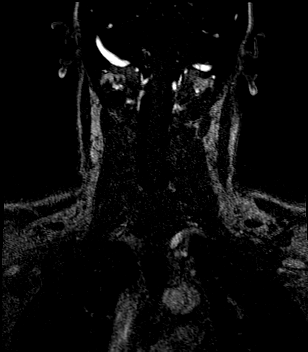
[im 80/80]
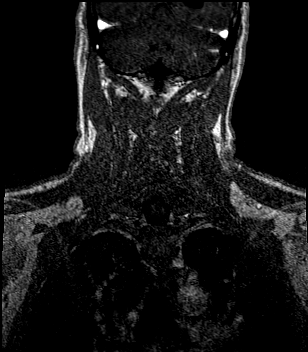

[Series 17: angio_fl3d_cor_post_ttc=3.0s_moco-adv_sub · coronal · 0.9mm · 0.85mm/px · 5 of 80 slices shown (2 of 2)]
[im 1/80]
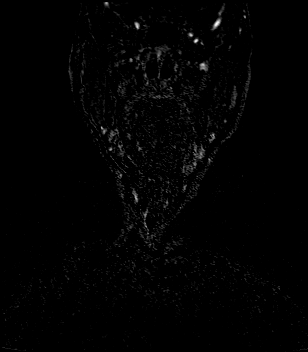
[im 20/80]
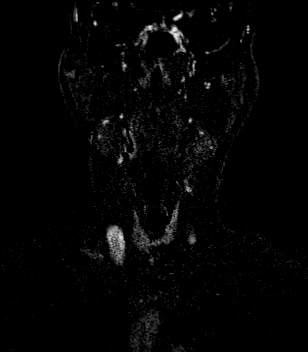
[im 40/80]
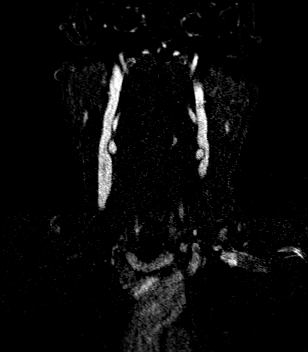
[im 60/80]
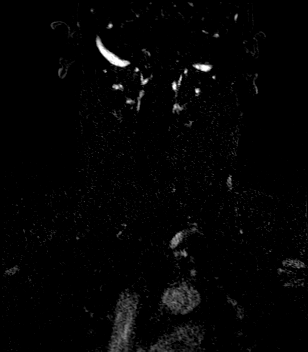
[im 80/80]
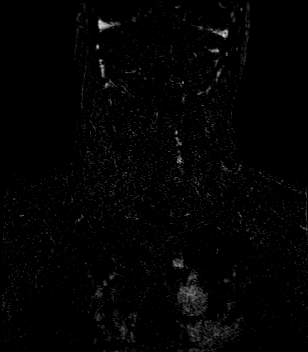

[42 of 48 positions shown; findings below may reference images not displayed]

FINDINGS: MR HEAD FINDINGS

Brain: There is no evidence of an acute infarct, intracranial
hemorrhage, mass, midline shift, or extra-axial fluid collection.
Mild prominence of the lateral and third ventricles may reflect mild
central dominant cerebral atrophy. The brain is normal in signal.

Vascular: Major intracranial vascular flow voids are preserved.

Skull and upper cervical spine: Unremarkable bone marrow signal.

Sinuses/Orbits: Unremarkable orbits. Complete opacification of the
left maxillary sinus with mild mucosal thickening in other sinuses.
Trace right mastoid effusion.

Other: None.

MR CIRCLE OF WILLIS FINDINGS

The visualized portions of the vertebral arteries are widely patent
to the basilar. Patent PICAs and SCAs are seen bilaterally although
the right PICA origin was not included. The basilar artery is widely
patent. There is a small right posterior communicating artery. The
PCAs are patent without evidence of a significant proximal stenosis.

The internal carotid arteries are widely patent from skull base to
carotid termini. ACAs and MCAs are patent without evidence of a
proximal branch occlusion or significant proximal stenosis. No
aneurysm is identified.

MRA NECK FINDINGS

There is a normal variant aortic arch branching pattern with the
left vertebral artery arising directly from the arch, better
demonstrated on a [Y5] chest CTA as the left vertebral artery origin
was incompletely imaged on today's MRA. The included portions of the
subclavian and brachiocephalic arteries are widely patent.

The common carotid and internal carotid arteries are patent and
smooth without evidence of a stenosis or dissection.

The vertebral arteries are patent and codominant with antegrade flow
bilaterally. There is no evidence of a vertebral artery dissection
or stenosis.
IMPRESSION: 1. No acute intracranial abnormality.
2. Negative head MRA.
3. Negative neck MRA.

## 2020-04-17 IMAGING — MR MR HEAD W/O CM
12 of 13 series · 44 of 48 positions shown · IV contrast (gadavist)
Comparison: Head CT [DATE]

CLINICAL DATA: Transient memory loss.

EXAM:
MR HEAD WITHOUT CONTRAST
MR CIRCLE OF WILLIS WITHOUT CONTRAST
MRA OF THE NECK WITHOUT AND WITH CONTRAST
TECHNIQUE: Multiplanar, multiecho pulse sequences of the brain, circle of
willis and surrounding structures were obtained without intravenous
contrast. Angiographic images of the neck were obtained using MRA
technique without and with intravenous contrast.
CONTRAST:  8.6mL GADAVIST GADOBUTROL 1 MMOL/ML IV SOLN

[Series 5: DWI · axial · 3.0mm · 0.88mm/px · z∈[-117,+40]mm · 7 of 108 slices shown (1 of 4)]
[im 1/108]
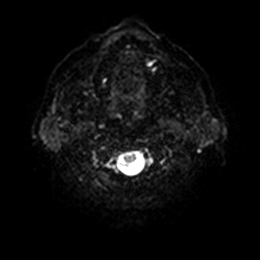
[im 18/108]
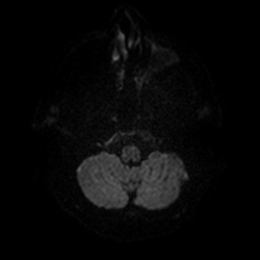
[im 36/108]
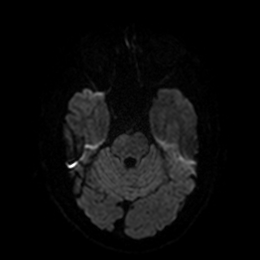
[im 54/108]
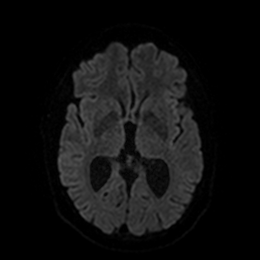
[im 72/108]
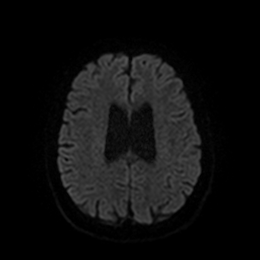
[im 90/108]
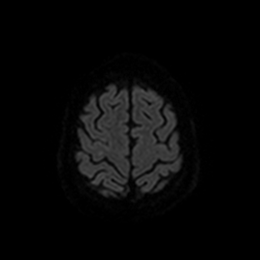
[im 108/108]
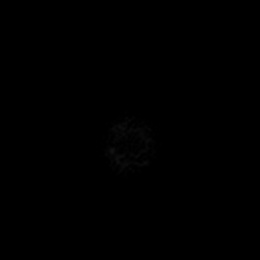

[Series 6: DWI · axial · 3.0mm · 0.88mm/px · z∈[-117,+40]mm · 4 of 54 slices shown (2 of 4)]
[im 1/54]
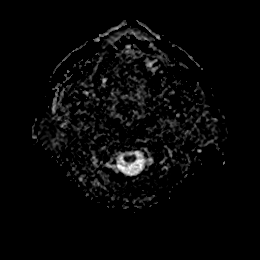
[im 18/54]
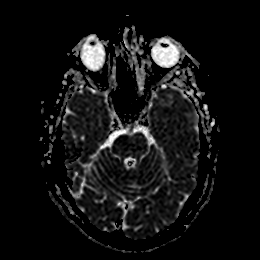
[im 36/54]
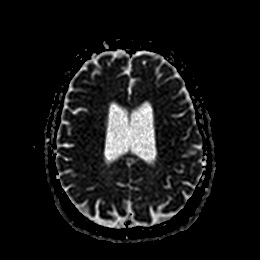
[im 54/54]
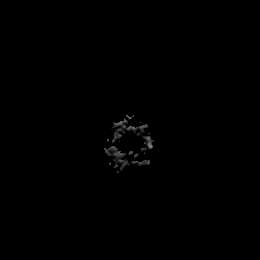

[Series 7: DWI · coronal · 4.0mm · 0.88mm/px · 6 of 76 slices shown (3 of 4)]
[im 1/76]
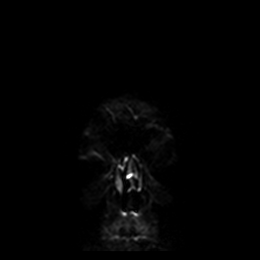
[im 16/76]
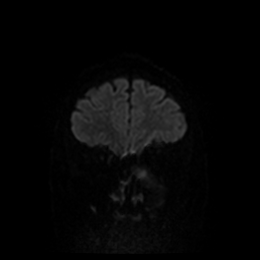
[im 31/76]
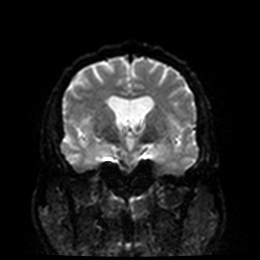
[im 46/76]
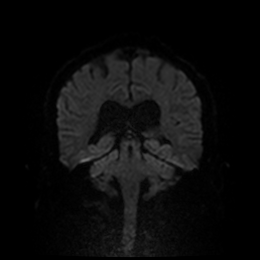
[im 61/76]
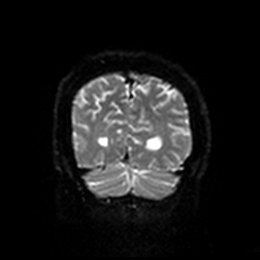
[im 76/76]
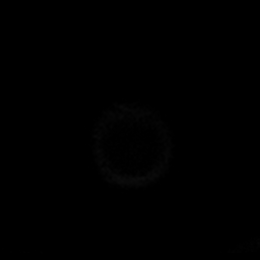

[Series 8: DWI · coronal · 4.0mm · 0.88mm/px · 3 of 38 slices shown (4 of 4)]
[im 1/38]
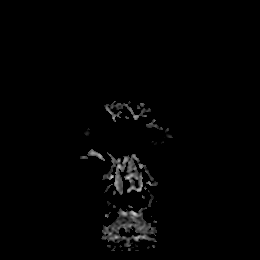
[im 19/38]
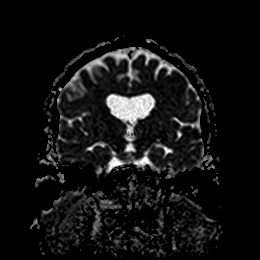
[im 38/38]
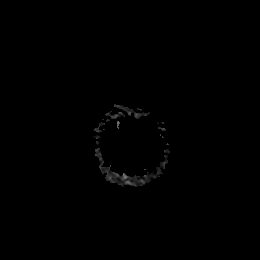

[Series 9: T1 · sagittal · 5.0mm · 0.75mm/px · 2 of 25 slices shown]
[im 1/25]
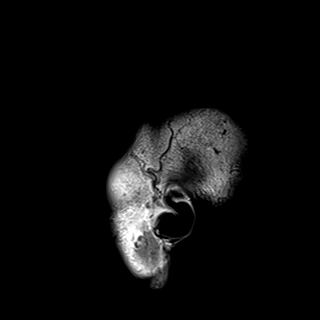
[im 25/25]
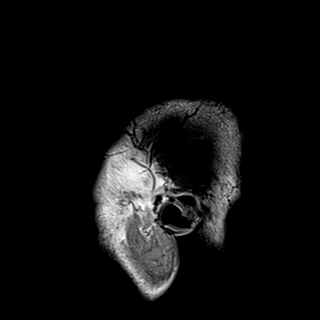

[Series 10: T2 · axial · 5.0mm · 0.72mm/px · z∈[-115,+40]mm · 2 of 27 slices shown (1 of 2)]
[im 1/27]
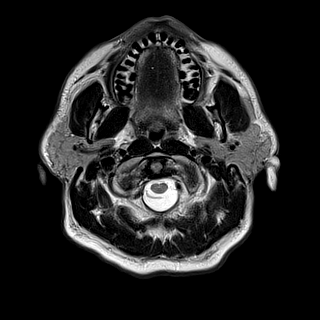
[im 27/27]
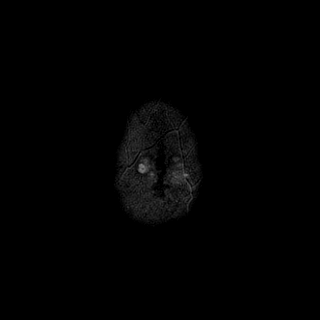

[Series 11: FLAIR · axial · 5.0mm · 0.45mm/px · z∈[-116,+39]mm · 2 of 27 slices shown]
[im 1/27]
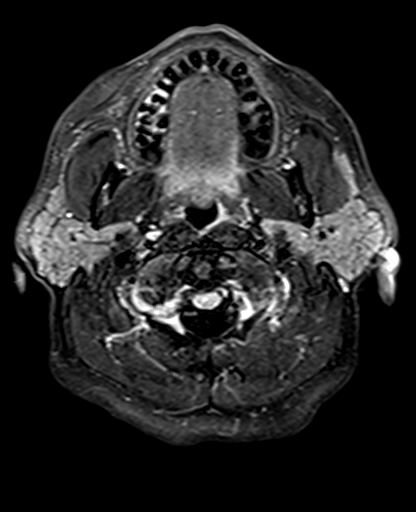
[im 27/27]
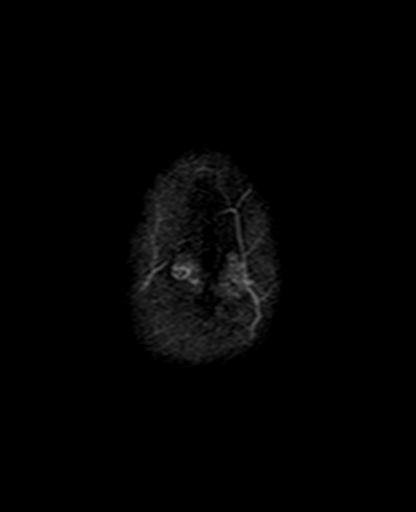

[Series 16: mag_images · axial · 3.0mm · 0.90mm/px · z∈[-123,+38]mm · 4 of 56 slices shown]
[im 1/56]
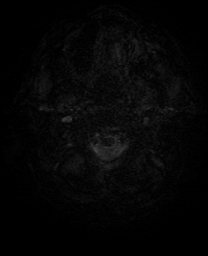
[im 19/56]
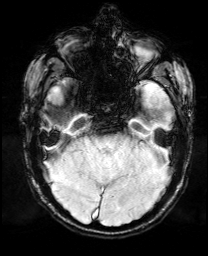
[im 37/56]
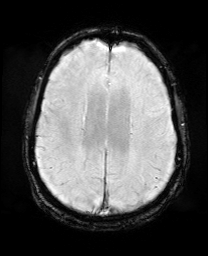
[im 56/56]
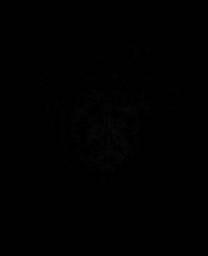

[Series 17: pha_images · axial · 3.0mm · 0.90mm/px · z∈[-123,+38]mm · 4 of 56 slices shown]
[im 1/56]
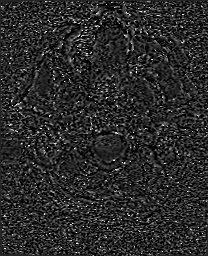
[im 19/56]
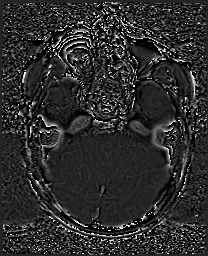
[im 37/56]
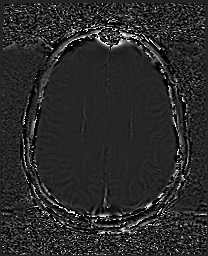
[im 56/56]
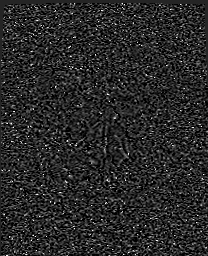

[Series 18: swi_images · axial · 3.0mm · 0.90mm/px · z∈[-123,+38]mm · 4 of 56 slices shown]
[im 1/56]
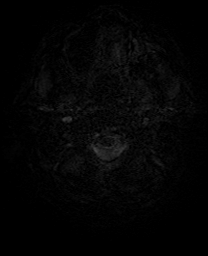
[im 19/56]
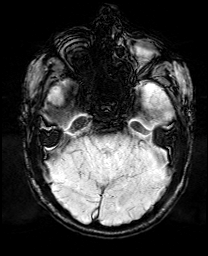
[im 37/56]
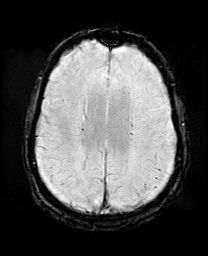
[im 56/56]
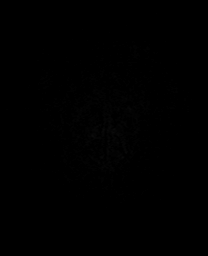

[Series 19: mip_images(sw) · axial · 24.0mm · 0.90mm/px · z∈[-113,+28]mm · 4 of 49 slices shown]
[im 1/49]
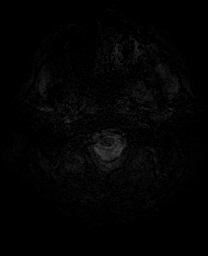
[im 17/49]
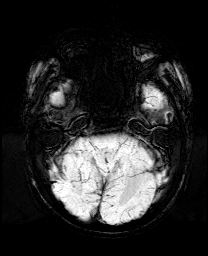
[im 33/49]
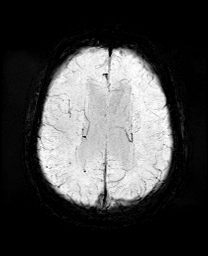
[im 49/49]
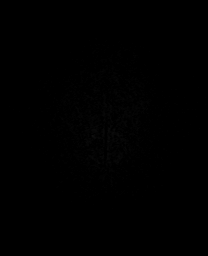

[Series 22: T2 · coronal · 5.0mm · 0.34mm/px · 2 of 31 slices shown (2 of 2)]
[im 1/31]
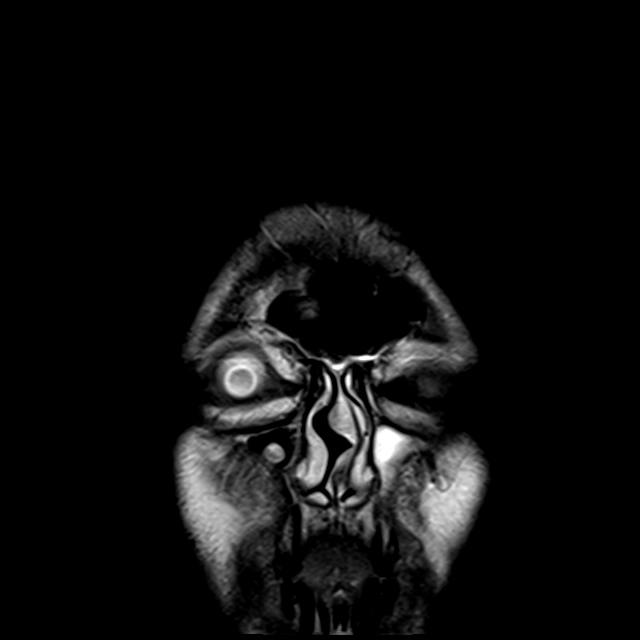
[im 31/31]
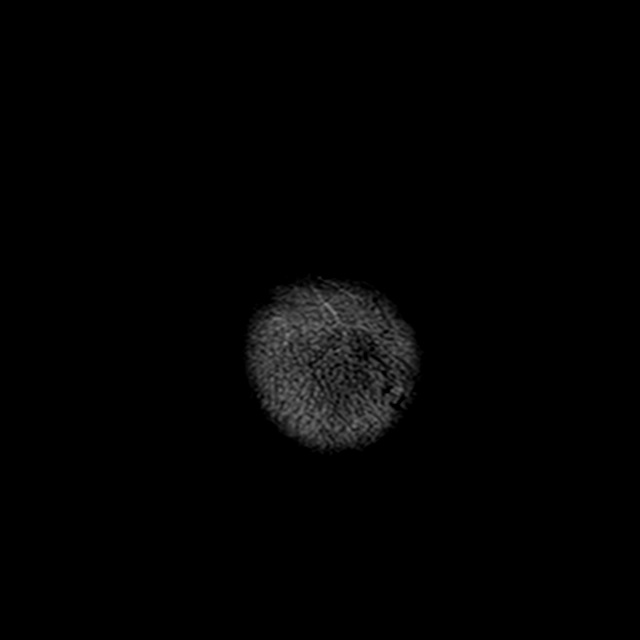

[44 of 48 positions shown; findings below may reference images not displayed]

FINDINGS: MR HEAD FINDINGS

Brain: There is no evidence of an acute infarct, intracranial
hemorrhage, mass, midline shift, or extra-axial fluid collection.
Mild prominence of the lateral and third ventricles may reflect mild
central dominant cerebral atrophy. The brain is normal in signal.

Vascular: Major intracranial vascular flow voids are preserved.

Skull and upper cervical spine: Unremarkable bone marrow signal.

Sinuses/Orbits: Unremarkable orbits. Complete opacification of the
left maxillary sinus with mild mucosal thickening in other sinuses.
Trace right mastoid effusion.

Other: None.

MR CIRCLE OF WILLIS FINDINGS

The visualized portions of the vertebral arteries are widely patent
to the basilar. Patent PICAs and SCAs are seen bilaterally although
the right PICA origin was not included. The basilar artery is widely
patent. There is a small right posterior communicating artery. The
PCAs are patent without evidence of a significant proximal stenosis.

The internal carotid arteries are widely patent from skull base to
carotid termini. ACAs and MCAs are patent without evidence of a
proximal branch occlusion or significant proximal stenosis. No
aneurysm is identified.

MRA NECK FINDINGS

There is a normal variant aortic arch branching pattern with the
left vertebral artery arising directly from the arch, better
demonstrated on a [Y5] chest CTA as the left vertebral artery origin
was incompletely imaged on today's MRA. The included portions of the
subclavian and brachiocephalic arteries are widely patent.

The common carotid and internal carotid arteries are patent and
smooth without evidence of a stenosis or dissection.

The vertebral arteries are patent and codominant with antegrade flow
bilaterally. There is no evidence of a vertebral artery dissection
or stenosis.
IMPRESSION: 1. No acute intracranial abnormality.
2. Negative head MRA.
3. Negative neck MRA.

## 2020-04-17 IMAGING — CT CT HEAD W/O CM
4 series · 17 of 47 positions shown, 19 images · non-contrast
Comparison: None.

CLINICAL DATA: TIA.

EXAM:
CT HEAD WITHOUT CONTRAST
TECHNIQUE: Contiguous axial images were obtained from the base of the skull
through the vertex without intravenous contrast.

[Series 3: head without · axial · non-contrast · 0.43mm/px · z∈[-159,-39]mm · 7 of 32 slices shown, 9 images]
[im 4/32  brain]
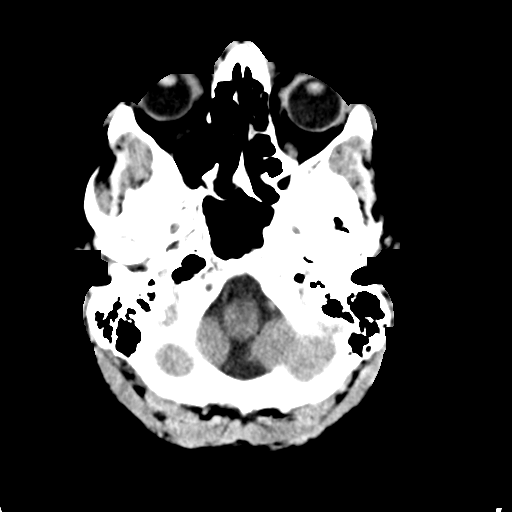
[im 4/32  bone]
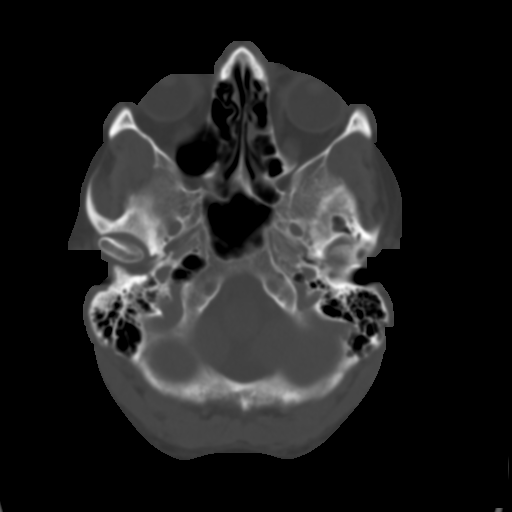
[im 8/32  brain]
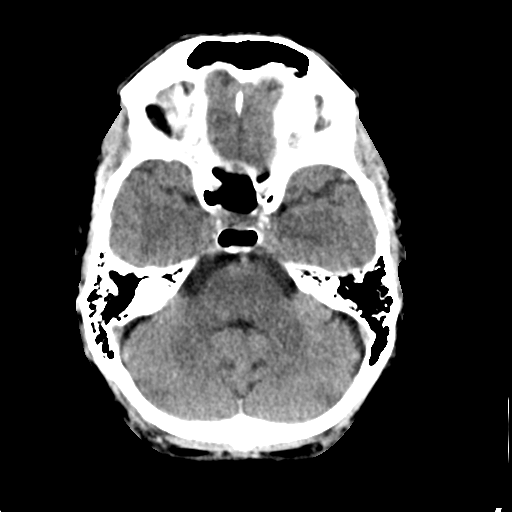
[im 12/32  brain]
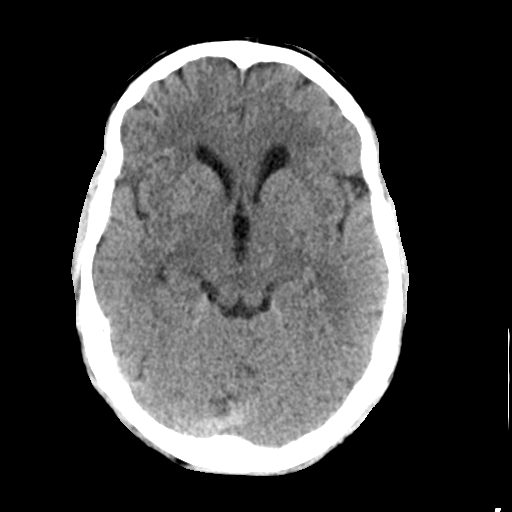
[im 16/32  brain]
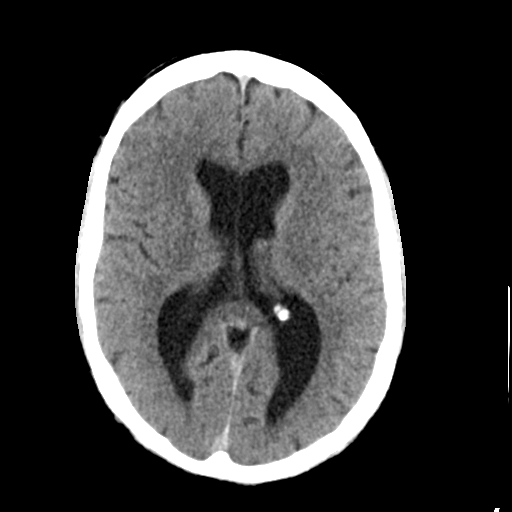
[im 20/32  brain]
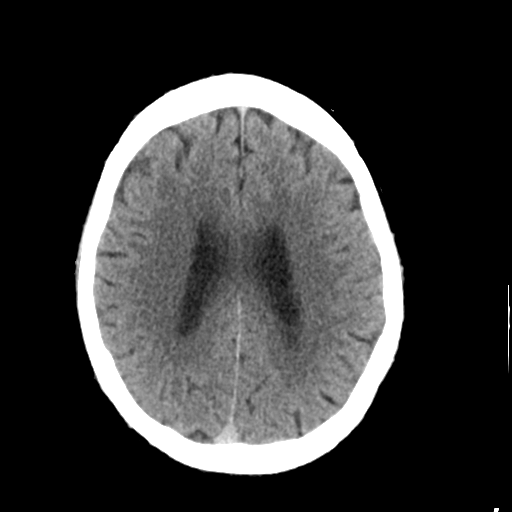
[im 20/32  bone]
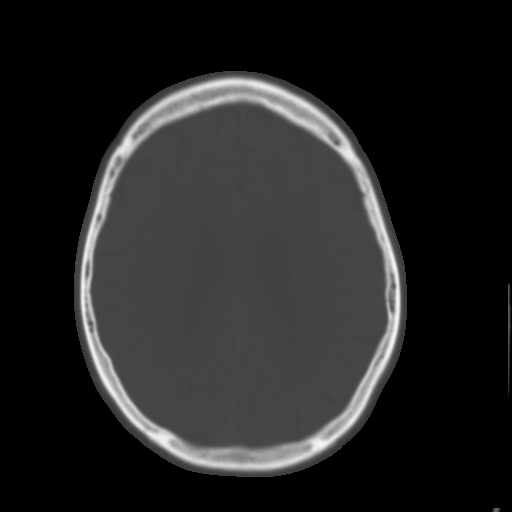
[im 24/32  brain]
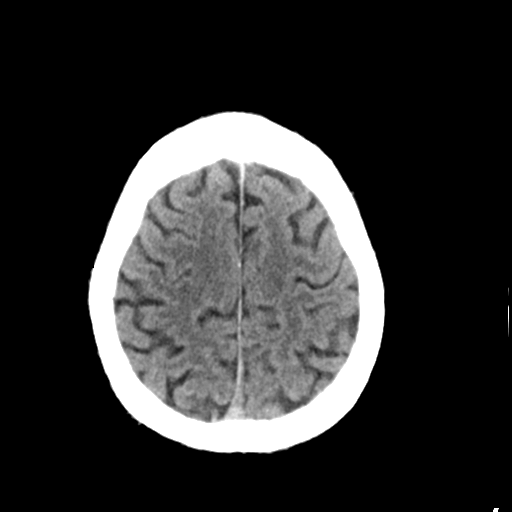
[im 28/32  brain]
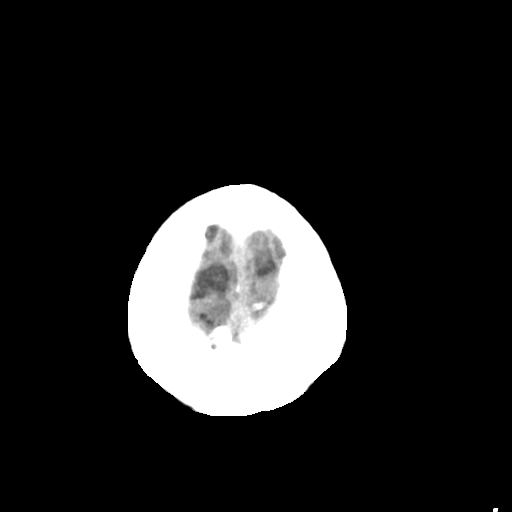

[Series 4: head bone · axial · 0.43mm/px · z∈[-160,-104]mm · 4 of 80 slices shown]
[im 8/80  bone]
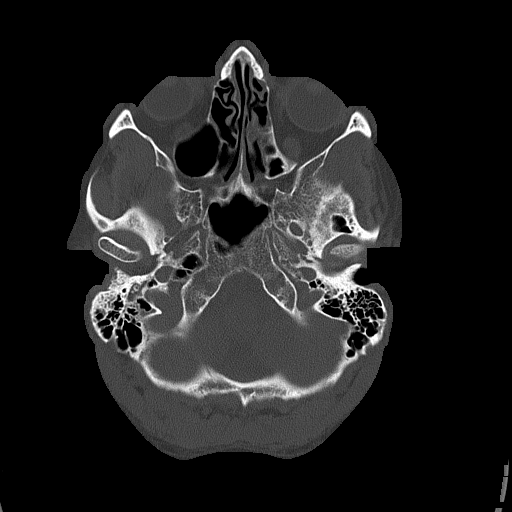
[im 16/80  bone]
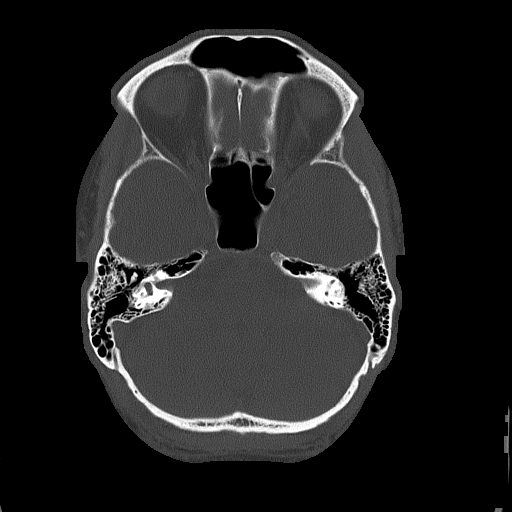
[im 24/80  bone]
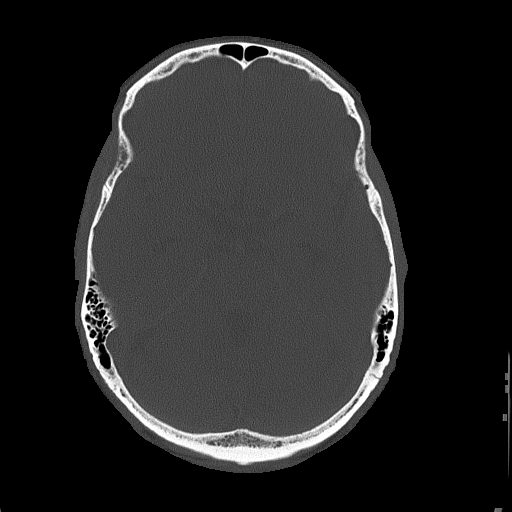
[im 36/80  bone]
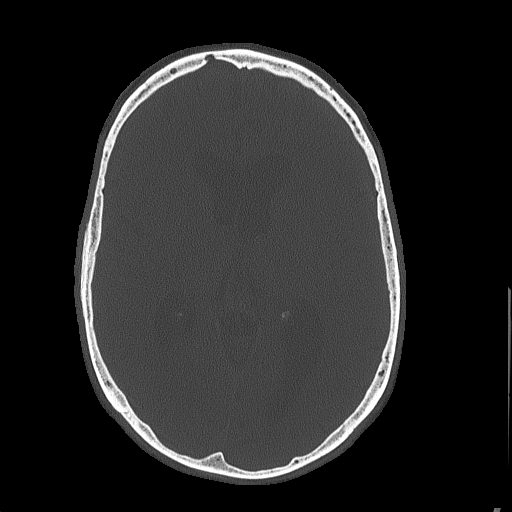

[Series 5: head without cor · coronal · non-contrast · 0.33mm/px · 3 of 70 slices shown]
[im 24/70  brain]
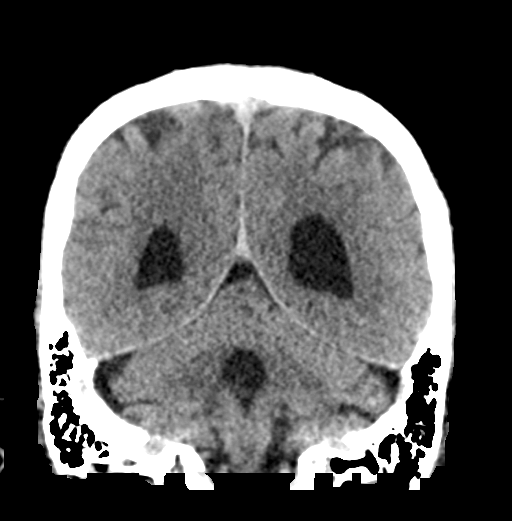
[im 31/70  brain]
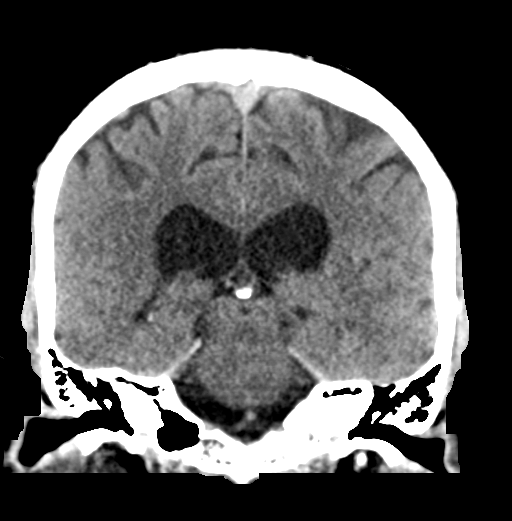
[im 39/70  brain]
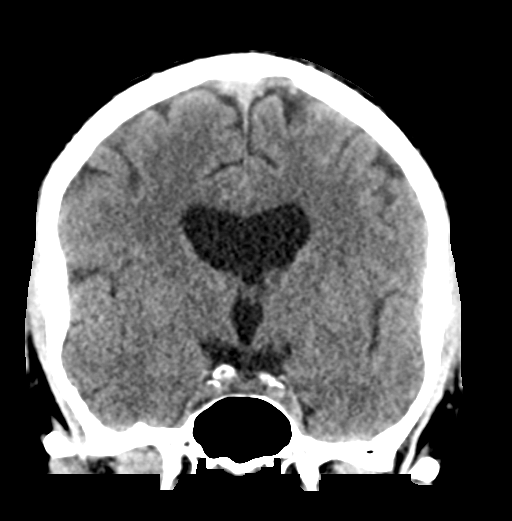

[Series 6: head without sag · sagittal · non-contrast · 0.34mm/px · 3 of 61 slices shown]
[im 21/61  brain]
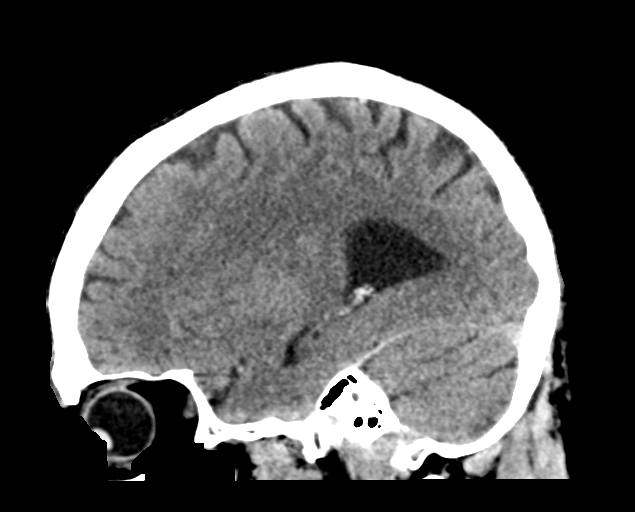
[im 31/61  brain]
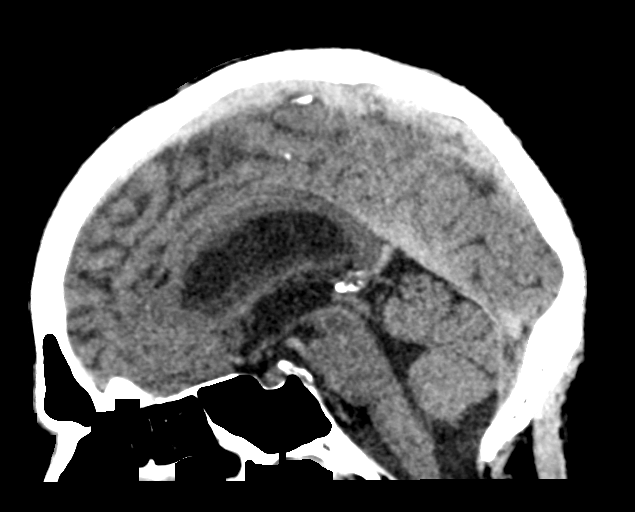
[im 41/61  brain]
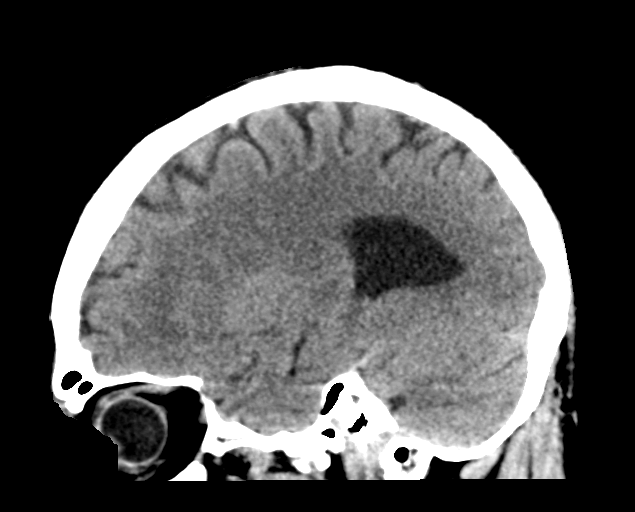

[17 of 47 positions shown; findings below may reference images not displayed]

FINDINGS: Brain: No evidence of acute large vascular territory infarction,
hemorrhage, hydrocephalus, extra-axial collection or mass
lesion/mass effect.

Vascular: No hyperdense vessel or unexpected calcification.

Skull: Normal. Negative for fracture or focal lesion.

Sinuses/Orbits: Partially imaged atretic left maxillary sinus with
opacification. Mild paranasal sinus mucosal thickening.

Other: No mastoid effusions.
IMPRESSION: No evidence of acute intracranial abnormality.

## 2020-04-17 IMAGING — MR MR MRA HEAD W/O CM
1 series · 18 of 48 positions shown · IV contrast (gadavist)
Comparison: Head CT [DATE]

CLINICAL DATA: Transient memory loss.

EXAM:
MR HEAD WITHOUT CONTRAST
MR CIRCLE OF WILLIS WITHOUT CONTRAST
MRA OF THE NECK WITHOUT AND WITH CONTRAST
TECHNIQUE: Multiplanar, multiecho pulse sequences of the brain, circle of
willis and surrounding structures were obtained without intravenous
contrast. Angiographic images of the neck were obtained using MRA
technique without and with intravenous contrast.
CONTRAST:  8.6mL GADAVIST GADOBUTROL 1 MMOL/ML IV SOLN

[Series 1: 3d cow · axial · 0.5mm · 0.41mm/px · z∈[-108,-27]mm · 18 of 172 slices shown]
[im 1/172]
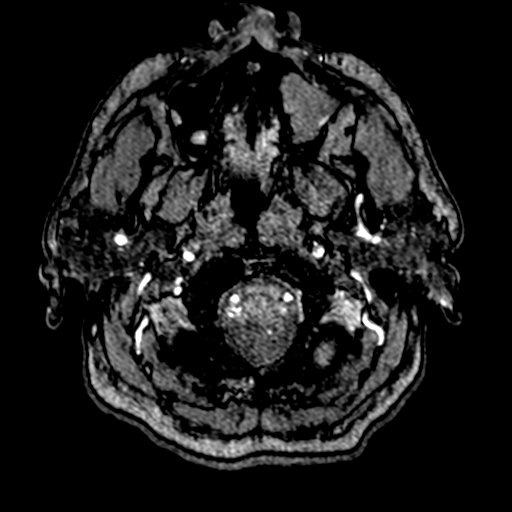
[im 4/172]
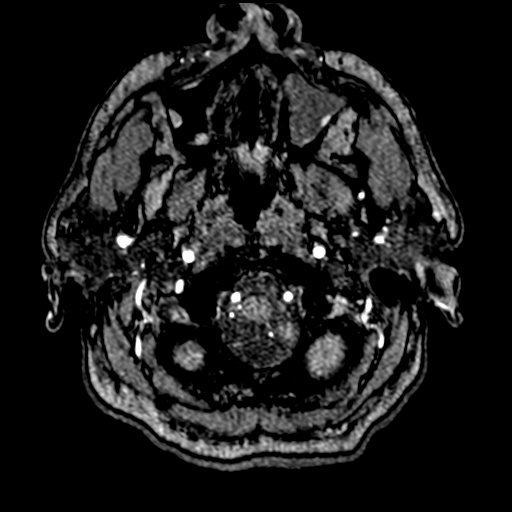
[im 8/172]
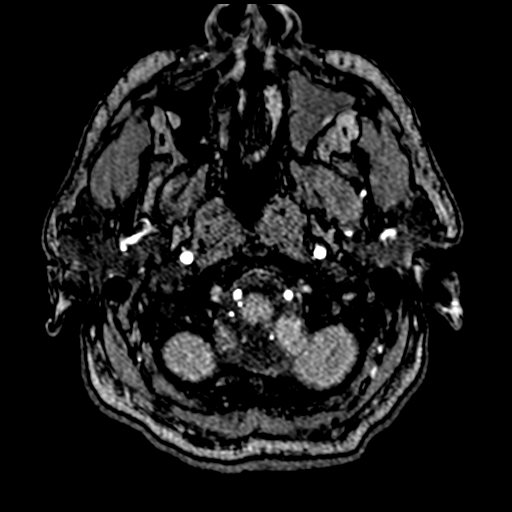
[im 11/172]
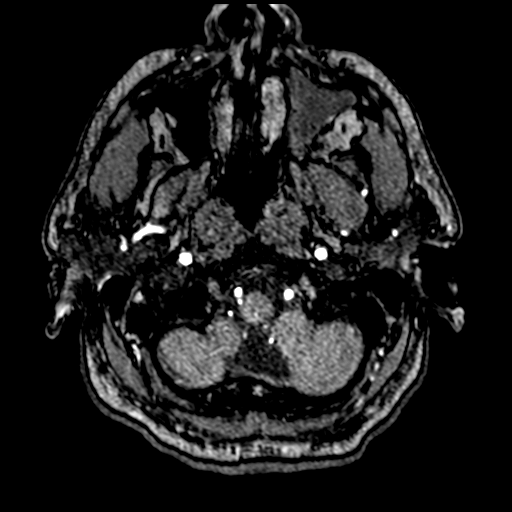
[im 15/172]
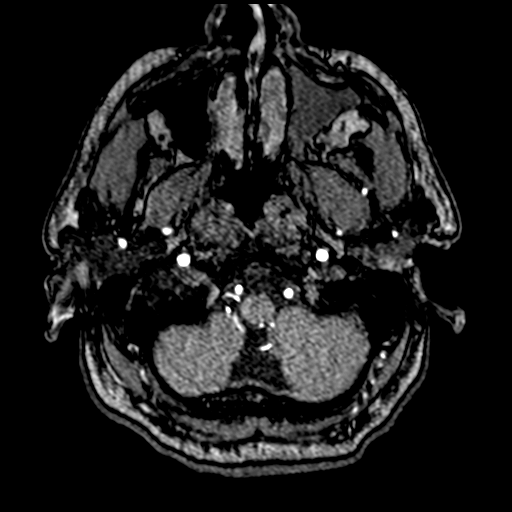
[im 19/172]
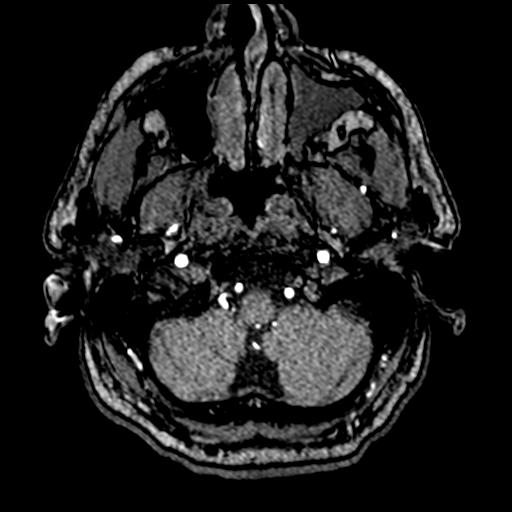
[im 22/172]
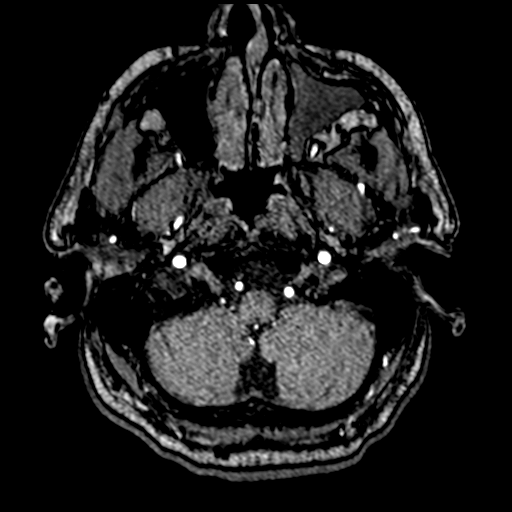
[im 26/172]
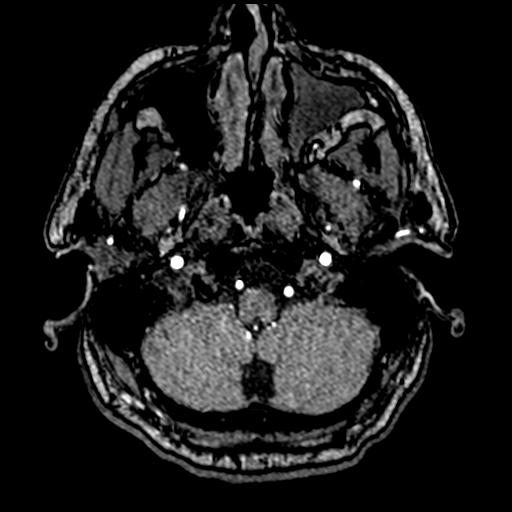
[im 30/172]
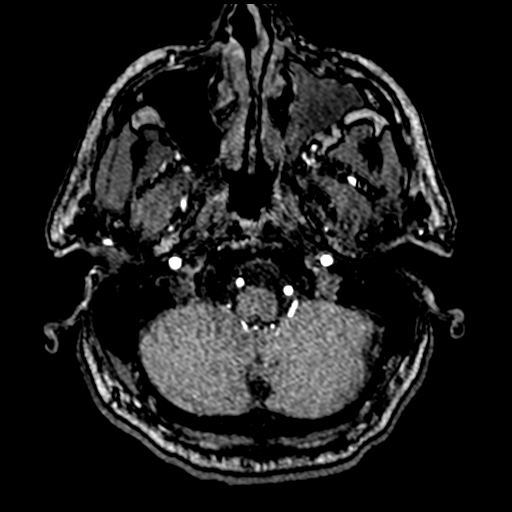
[im 33/172]
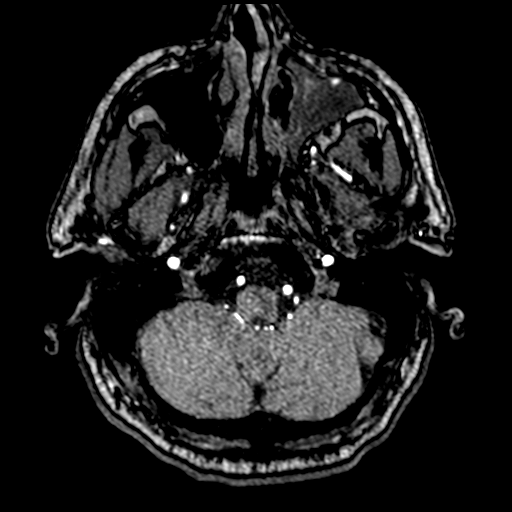
[im 55/172]
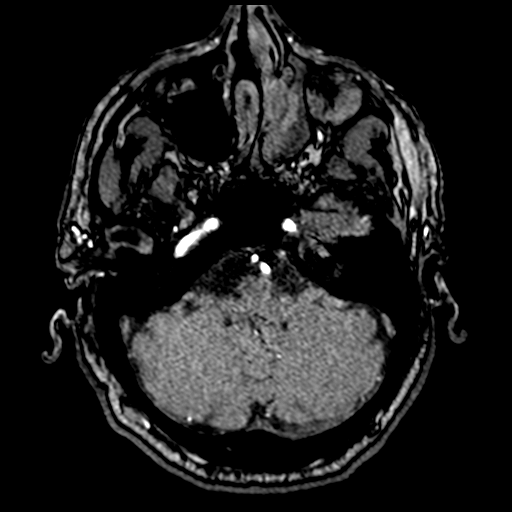
[im 77/172]
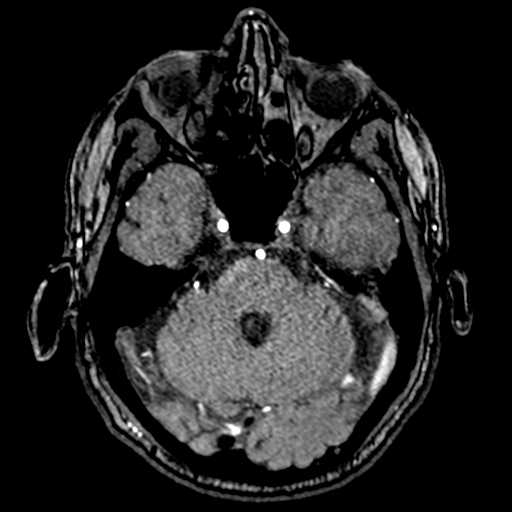
[im 88/172]
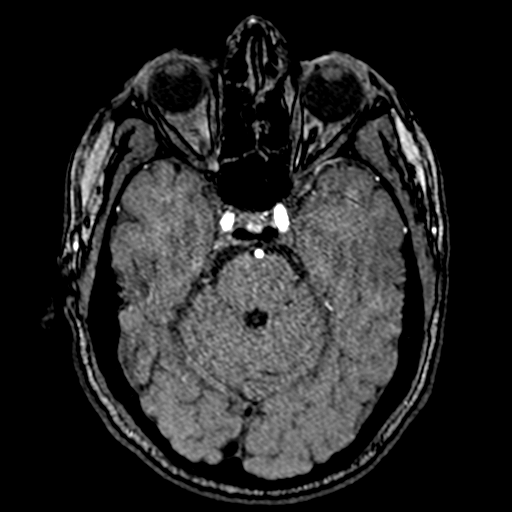
[im 99/172]
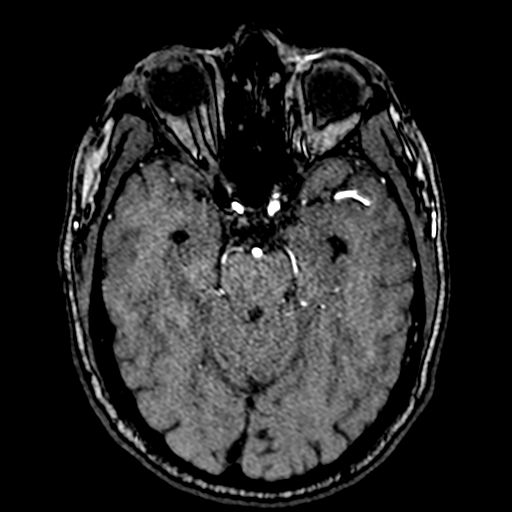
[im 121/172]
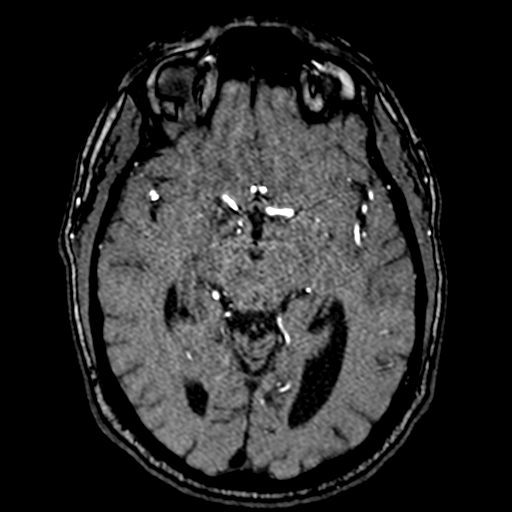
[im 142/172]
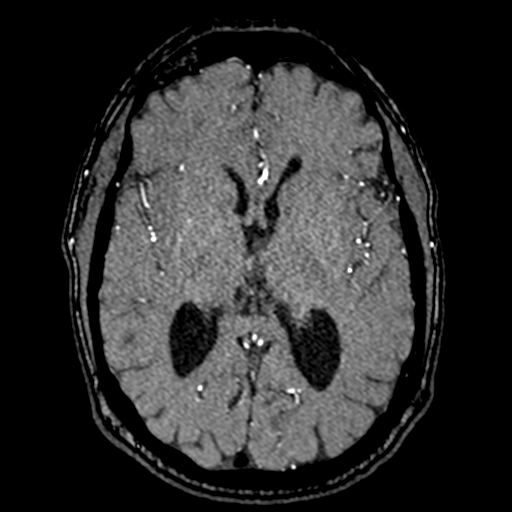
[im 146/172]
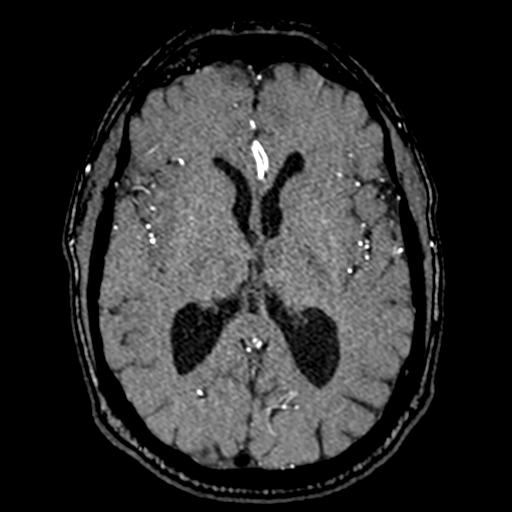
[im 164/172]
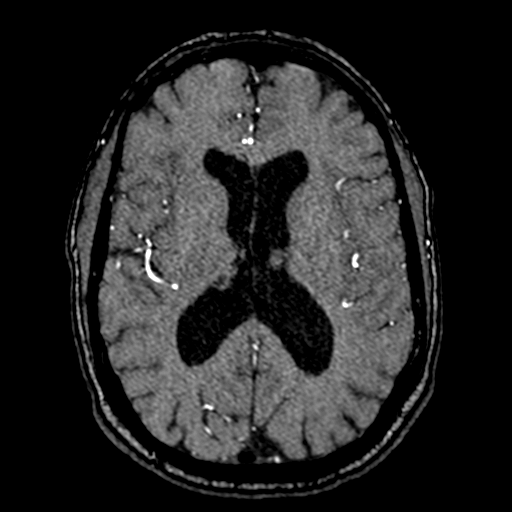

[18 of 48 positions shown; findings below may reference images not displayed]

FINDINGS: MR HEAD FINDINGS

Brain: There is no evidence of an acute infarct, intracranial
hemorrhage, mass, midline shift, or extra-axial fluid collection.
Mild prominence of the lateral and third ventricles may reflect mild
central dominant cerebral atrophy. The brain is normal in signal.

Vascular: Major intracranial vascular flow voids are preserved.

Skull and upper cervical spine: Unremarkable bone marrow signal.

Sinuses/Orbits: Unremarkable orbits. Complete opacification of the
left maxillary sinus with mild mucosal thickening in other sinuses.
Trace right mastoid effusion.

Other: None.

MR CIRCLE OF WILLIS FINDINGS

The visualized portions of the vertebral arteries are widely patent
to the basilar. Patent PICAs and SCAs are seen bilaterally although
the right PICA origin was not included. The basilar artery is widely
patent. There is a small right posterior communicating artery. The
PCAs are patent without evidence of a significant proximal stenosis.

The internal carotid arteries are widely patent from skull base to
carotid termini. ACAs and MCAs are patent without evidence of a
proximal branch occlusion or significant proximal stenosis. No
aneurysm is identified.

MRA NECK FINDINGS

There is a normal variant aortic arch branching pattern with the
left vertebral artery arising directly from the arch, better
demonstrated on a [Y5] chest CTA as the left vertebral artery origin
was incompletely imaged on today's MRA. The included portions of the
subclavian and brachiocephalic arteries are widely patent.

The common carotid and internal carotid arteries are patent and
smooth without evidence of a stenosis or dissection.

The vertebral arteries are patent and codominant with antegrade flow
bilaterally. There is no evidence of a vertebral artery dissection
or stenosis.
IMPRESSION: 1. No acute intracranial abnormality.
2. Negative head MRA.
3. Negative neck MRA.

## 2020-04-17 IMAGING — DX DG CHEST 1V PORT
1 series · 1 of 1 positions shown · non-contrast
Comparison: [DATE]

CLINICAL DATA: Cough.

EXAM:
PORTABLE CHEST 1 VIEW

[chest ap]
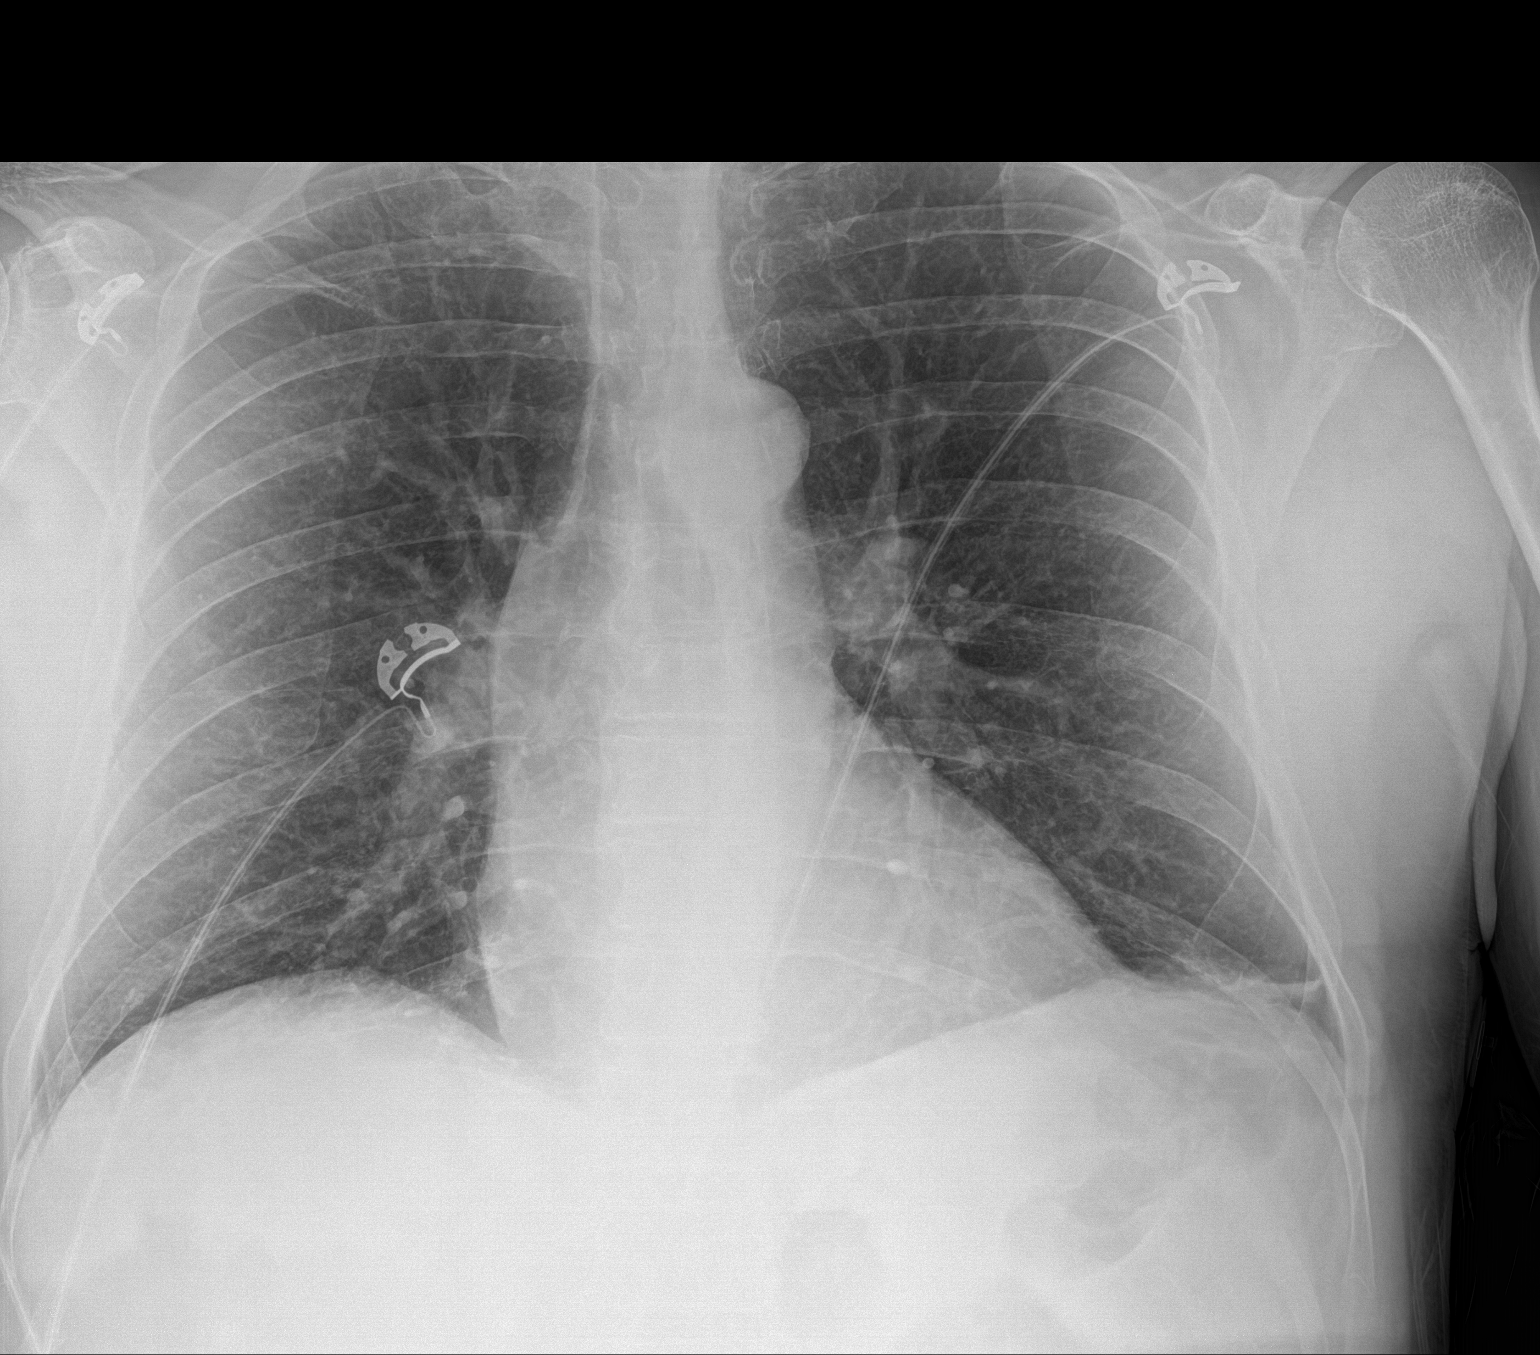

[1 of 1 positions shown; findings below may reference images not displayed]

FINDINGS: Mild linear scarring and/or atelectasis is seen within the lateral
aspect of the left lung base. There is no evidence of acute
infiltrate, pleural effusion or pneumothorax. The heart size and
mediastinal contours are within normal limits. The visualized
skeletal structures are unremarkable.
IMPRESSION: Mild left basilar linear scarring and/or atelectasis.

## 2020-04-17 MED ORDER — ACETAMINOPHEN 325 MG PO TABS
650.0000 mg | ORAL_TABLET | Freq: Four times a day (QID) | ORAL | Status: DC | PRN
  Administered 2020-04-18: 650 mg via ORAL
  Filled 2020-04-17: qty 2

## 2020-04-17 MED ORDER — ROSUVASTATIN CALCIUM 20 MG PO TABS
40.0000 mg | ORAL_TABLET | Freq: Every day | ORAL | Status: DC
  Administered 2020-04-17 – 2020-04-18 (×2): 40 mg via ORAL
  Filled 2020-04-17 (×2): qty 2

## 2020-04-17 MED ORDER — CITALOPRAM HYDROBROMIDE 10 MG PO TABS
10.0000 mg | ORAL_TABLET | Freq: Every day | ORAL | Status: DC
  Administered 2020-04-17 – 2020-04-18 (×2): 10 mg via ORAL
  Filled 2020-04-17 (×2): qty 1

## 2020-04-17 MED ORDER — SODIUM CHLORIDE 0.9% FLUSH: 3.0000 mL | Freq: Once | INTRAVENOUS | Status: DC

## 2020-04-17 MED ORDER — ACETAMINOPHEN 650 MG RE SUPP: 650.0000 mg | Freq: Four times a day (QID) | RECTAL | Status: DC | PRN

## 2020-04-17 MED ORDER — ENOXAPARIN SODIUM 40 MG/0.4ML ~~LOC~~ SOLN
40.0000 mg | SUBCUTANEOUS | Status: DC
  Administered 2020-04-17: 40 mg via SUBCUTANEOUS
  Filled 2020-04-17: qty 0.4

## 2020-04-17 MED ORDER — ASPIRIN EC 81 MG PO TBEC
81.0000 mg | DELAYED_RELEASE_TABLET | Freq: Every day | ORAL | Status: DC
  Administered 2020-04-17 – 2020-04-18 (×2): 81 mg via ORAL
  Filled 2020-04-17 (×2): qty 1

## 2020-04-17 MED ORDER — ICOSAPENT ETHYL 1 G PO CAPS
2.0000 g | ORAL_CAPSULE | Freq: Two times a day (BID) | ORAL | Status: DC
  Administered 2020-04-17 – 2020-04-18 (×2): 2 g via ORAL
  Filled 2020-04-17 (×4): qty 2

## 2020-04-17 MED ORDER — GADOBUTROL 1 MMOL/ML IV SOLN
8.6000 mL | Freq: Once | INTRAVENOUS | Status: AC | PRN
  Administered 2020-04-17: 8.6 mL via INTRAVENOUS

## 2020-04-17 NOTE — ED Triage Notes (Addendum)
Pt reports being at baseline this morning, interacted with family appropriately. Pt got out of shower around 0800 and spoke with son and had memory loss, could not remember his son had a job, did not remember conversations with his boss. Has dull bilateral headache. 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 All symptoms have resolved pta and no deficits are noted at triage.

## 2020-04-17 NOTE — ED Notes (Signed)
Spoke with dr zammit regarding patients symptoms and stroke protocol initiated. Pt instructed to notify staff of any changes in condition while waiting.

## 2020-04-17 NOTE — Consult Note (Addendum)
Neurology Consult H&P  CC: Transient amnestic event.  History is obtained from: Patient and wife  HPI: Bradley Marquez is a 64 y.o. male borderline elevated blood pressure presents with transient memory loss. His wife states that last night he did not remember they had gone to [employer] just before going to bed last night 04/16/2020. The patient woke with headache but subjectively at his baseline and his family noticed by ~0730 he was confused and seemed to have memory loss. Though he remembers his son and wife he was confused as to their daily routine/plans.   He had a lucid conversation with his boss but could not remember the purpose of their conversation. His said she called him and he looked confused or "overwhelmed" and they sought medical attention.  Denies changes in speech/weakness/numbness/vision changes, lightheadedness, dizziness/fever/chills.    By the time they arrived to the hospital he was back to his baseline as confirmed by his wife. He stated that he had a similar episode but shorter in duration many years ago.  Currently mild dull headache, cough and nasal congestion was recently started on prednisone, doxycycline and albuterol by his physician for a cold.  ROS: A complete ROS was performed and is negative except as noted in the HPI.  Past Medical History:  Diagnosis Date  . High cholesterol   . Seasonal allergies    History reviewed. No pertinent family history.  Social History:  reports that he has never smoked. He does not have any smokeless tobacco history on file. No history on file for alcohol use and drug use.  Prior to Admission medications   Medication Sig Start Date End Date Taking? Authorizing Provider  citalopram (CELEXA) 10 MG tablet Take 10 mg by mouth daily. 02/07/20   [provider]  CRESTOR 40 MG tablet Take 40 mg by mouth daily. 03/31/20   [provider]  doxycycline (VIBRAMYCIN) 100 MG capsule Take 100 mg by mouth daily. 01/30/20    [provider]  ipratropium-albuterol (DUONEB) 0.5-2.5 (3) MG/3ML SOLN Take 3 mLs by nebulization 2 (two) times daily. 04/16/20   [provider]  levocetirizine (XYZAL) 5 MG tablet Take 5 mg by mouth daily. 03/08/20   [provider]  minocycline (MINOCIN) 100 MG capsule Take 100 mg by mouth 2 (two) times daily. 04/16/20   [provider]  montelukast (SINGULAIR) 10 MG tablet Take 10 mg by mouth daily. 03/31/20   [provider]  predniSONE (DELTASONE) 5 MG tablet Take by mouth. 04/16/20   [provider]  tadalafil (CIALIS) 20 MG tablet Take by mouth. 03/08/20   [provider]  VASCEPA 1 g capsule Take 2 g by mouth 2 (two) times daily. 03/31/20   [provider]   Exam: Current vital signs: BP (!) 153/95   Pulse 73   Temp 97.9 F (36.6 C) (Oral)   Resp 20   Ht 5\' 10"  (1.778 m)   Wt 86.2 kg   SpO2 96%   BMI 27.26 kg/m    Physical Exam  Constitutional: Appears well-developed and well-nourished.  Psych: Affect appropriate to situation Eyes: No scleral injection HENT: No OP obstrucion Head: Normocephalic.  Cardiovascular: Normal rate and regular rhythm.  Respiratory: Cough but normal chest excursions.  GI: Soft.  No distension. There is no tenderness.  Skin: WDI  Neuro: Mental Status: Patient is awake, alert, oriented to person, place, month, year, and situation. Patient is able to give a clear and coherent history. No signs of aphasia  or neglect Cranial Nerves: II: Visual Fields are full. Pupils are equal, round, and reactive to light.   III,IV, VI: EOMI without ptosis or diploplia.  V: Facial sensation is symmetric to temperature VII: Facial movement is symmetric.  VIII: hearing is intact to voice X: Uvula elevates symmetrically XI: Shoulder shrug is symmetric. XII: tongue is midline without atrophy or fasciculations.  Tone is normal. Bulk is normal. 5/5 strength was present in all four extremities.   Sensation is symmetric to light touch and temperature in the arms and legs. DTRs-S 2+   I have reviewed the images obtained: which did not show acute abnormality or chronic large territory infarction.  Impression: 63 year old man with transient encephalopathy now back to baseline. The symptoms are suggestive of transient global amnesia, however TIA/Stroke cannot be completely excluded.  - Chest X-ray - Brain MRI and MRA head and neck. - Recommend TTE. - rEEG. - Chest X-ray. - Lipid panel. - Statin for LDL > 70. - Continue icosapent. - HbA1c. - Aspirin 81mg  daily. - SBP goal - permissive hypertension first 24 h < 220/110. - Telemetry monitoring for arrythmia - Recommend bedside Swallow screen - Recommend Stroke educatio - Recommend PT/OT/SLP consult   Electronically signed by: Dr. Pager: [phone] 04/17/2020, 5:50 PM

## 2020-04-17 NOTE — Progress Notes (Signed)
EEG complete - results pending 

## 2020-04-17 NOTE — ED Notes (Signed)
Report called to 3W.

## 2020-04-17 NOTE — ED Provider Notes (Signed)
[facility] EMERGENCY DEPARTMENT Provider Note   CSN: [id number] Arrival date & time: 04/17/20  1007     History Chief Complaint  Patient presents with  . Headache  . Memory Loss    Bradley Marquez is a 64 y.o. male.  HPI   64 year old male with a history of hyperlipidemia, seasonal allergies, who presents to the emergency department today for evaluation of memory loss and headache.  Patient states that this morning he woke up with a headache.  He was at his baseline when he first woke up however around 730 he was noted to be acutely confused and have memory loss.  He had a's conversation with his son where he could not remember that his son had a job or where he was going.  He states he also had a conversation with someone he works with but could not remember why they had a meeting today.  His wife called him and she noted that he also appeared to be confused or possibly overwhelmed so the decision was made to be evaluated in the emergency department.  He denies that he had any associated vision changes, lightheadedness, dizziness, unilateral numbness/weakness.  His wife denies any slurred speech.  He denies any aphasia.  He does complain of the mild dull headache.  He has had some nasal congestion and a productive cough without fevers for the last several days and was recently started on doxycycline and albuterol by his physician.  He did not have a chest x-ray or Covid test at that time.  Past Medical History:  Diagnosis Date  . High cholesterol   . Seasonal allergies     There are no problems to display for this patient.   History reviewed. No pertinent surgical history.     History reviewed. No pertinent family history.  Social History   Tobacco Use  . Smoking status: Never Smoker  Substance Use Topics  . Alcohol use: Not on file  . Drug use: Not on file    Home Medications Prior to Admission medications   Medication Sig Start Date End Date Taking?  Authorizing Provider  citalopram (CELEXA) 10 MG tablet Take 10 mg by mouth daily. 02/07/20   [provider]  CRESTOR 40 MG tablet Take 40 mg by mouth daily. 03/31/20   [provider]  doxycycline (VIBRAMYCIN) 100 MG capsule Take 100 mg by mouth daily. 01/30/20   [provider]  ipratropium-albuterol (DUONEB) 0.5-2.5 (3) MG/3ML SOLN Take 3 mLs by nebulization 2 (two) times daily. 04/16/20   [provider]  levocetirizine (XYZAL) 5 MG tablet Take 5 mg by mouth daily. 03/08/20   [provider]  minocycline (MINOCIN) 100 MG capsule Take 100 mg by mouth 2 (two) times daily. 04/16/20   [provider]  montelukast (SINGULAIR) 10 MG tablet Take 10 mg by mouth daily. 03/31/20   [provider]  predniSONE (DELTASONE) 5 MG tablet Take by mouth. 04/16/20   [provider]  tadalafil (CIALIS) 20 MG tablet Take by mouth. 03/08/20   [provider]  VASCEPA 1 g capsule Take 2 g by mouth 2 (two) times daily. 03/31/20   [provider]    Allergies    Oxycodone and Hydrocodone  Review of Systems   Review of Systems  Constitutional: Negative for chills and fever.  HENT: Positive for congestion. Negative for ear pain and sore throat.   Eyes: Negative for visual disturbance.  Respiratory: Positive for cough. Negative for shortness  of breath.   Cardiovascular: Negative for chest pain.  Gastrointestinal: Negative for abdominal pain, constipation, diarrhea, nausea and vomiting.  Genitourinary: Negative for dysuria and hematuria.  Musculoskeletal: Negative for back pain.  Skin: Negative for color change and rash.  Neurological: Positive for headaches. Negative for dizziness, seizures, syncope, speech difficulty, weakness, light-headedness and numbness.       Transient memory loss  All other systems reviewed and are negative.   Physical Exam Updated Vital Signs BP (!) 153/95   Pulse 73   Temp 97.9 F (36.6 C) (Oral)    Resp 20   Ht 5\' 10"  (1.778 m)   Wt 86.2 kg   SpO2 96%   BMI 27.26 kg/m   Physical Exam Vitals and nursing note reviewed.  Constitutional:      Appearance: He is well-developed. He is not ill-appearing.  HENT:     Head: Normocephalic and atraumatic.  Eyes:     Conjunctiva/sclera: Conjunctivae normal.  Cardiovascular:     Rate and Rhythm: Normal rate and regular rhythm.     Heart sounds: Normal heart sounds. No murmur heard.   Pulmonary:     Effort: Pulmonary effort is normal. No respiratory distress.     Breath sounds: Normal breath sounds. No wheezing, rhonchi or rales.  Abdominal:     General: Bowel sounds are normal.     Palpations: Abdomen is soft.     Tenderness: There is no abdominal tenderness.  Musculoskeletal:     Cervical back: Neck supple.  Skin:    General: Skin is warm and dry.  Neurological:     Mental Status: He is alert.     Comments: Mental Status:  Alert, thought content appropriate, able to give a coherent history. Speech fluent without evidence of aphasia. Able to follow 2 step commands without difficulty.  Cranial Nerves:  II:  Peripheral visual fields grossly normal, pupils equal, round, reactive to light III,IV, VI: ptosis not present, extra-ocular motions intact bilaterally  V,VII: smile symmetric, facial light touch sensation equal VIII: hearing grossly normal to voice  X: uvula elevates symmetrically  XI: bilateral shoulder shrug symmetric and strong XII: midline tongue extension without fassiculations Motor:  Normal tone. 5/5 strength of BUE and BLE major muscle groups including strong and equal grip strength and dorsiflexion/plantar flexion Sensory: light touch normal in all extremities. Cerebellar: normal finger-to-nose with bilateral upper extremities, normal heel to shin Neg pronator drift      ED Results / Procedures / Treatments   Labs (all labs ordered are listed, but only abnormal results are displayed) Labs Reviewed   COMPREHENSIVE METABOLIC PANEL - Abnormal; Notable for the following components:      Result Value   Glucose, Bld 109 (*)    All other components within normal limits  RESPIRATORY PANEL BY RT PCR (FLU A&B, COVID)  PROTIME-INR  APTT  CBC  DIFFERENTIAL  RAPID URINE DRUG SCREEN, HOSP PERFORMED  ETHANOL  SALICYLATE LEVEL  TSH  ACETAMINOPHEN LEVEL  I-STAT CHEM 8, ED    EKG EKG Interpretation  Date/Time:  Thursday April 17 2020 10:16:44 EDT Ventricular Rate:  65 PR Interval:  172 QRS Duration: 94 QT Interval:  408 QTC Calculation: 424 R Axis:   -30 Text Interpretation: Normal sinus rhythm Left axis deviation Minimal voltage criteria for LVH, may be normal variant ( R in aVL ) Septal infarct , age undetermined Abnormal ECG No significant change since last tracing Confirmed by 01-14-1989 (Lorre Nick) on 04/17/2020 5:25:38 PM  Radiology CT HEAD WO CONTRAST  Result Date: 04/17/2020 CLINICAL DATA:  TIA. EXAM: CT HEAD WITHOUT CONTRAST TECHNIQUE: Contiguous axial images were obtained from the base of the skull through the vertex without intravenous contrast. COMPARISON:  None. FINDINGS: Brain: No evidence of acute large vascular territory infarction, hemorrhage, hydrocephalus, extra-axial collection or mass lesion/mass effect. Vascular: No hyperdense vessel or unexpected calcification. Skull: Normal. Negative for fracture or focal lesion. Sinuses/Orbits: Partially imaged atretic left maxillary sinus with opacification. Mild paranasal sinus mucosal thickening. Other: No mastoid effusions. IMPRESSION: No evidence of acute intracranial abnormality. Electronically Signed   By: Feliberto Harts MD   On: 04/17/2020 11:16    Procedures Procedures (including critical care time)  Medications Ordered in ED Medications  sodium chloride flush (NS) 0.9 % injection 3 mL (3 mLs Intravenous Not Given 04/17/20 1500)    ED Course  I have reviewed the triage vital signs and the nursing  notes.  Pertinent labs & imaging results that were available during my care of the patient were reviewed by me and considered in my medical decision making (see chart for details).    MDM Rules/Calculators/A&P                          64 year old male presenting to the emergency department today for evaluation of altered mental status and transient memory loss.  Patient symptoms had completely resolved upon arrival to the ED therefore he is not a TPA candidate and a code stroke was not initiated however stroke protocol was ordered.  Reviewed/interpreted labs CBC without leukocytosis or anemia CMP with normal electrolytes, kidney and liver function Coags are negative Covid test pending  CT head - No evidence of acute intracranial abnormality.  4:06 PM CONSULT with Felicie Morn, neuro APP who recommends obtaining MRI brain and EEG.  5:57 PM CONSULT with Dr. Anner Crete with resident service who accepts patient for admission.   6:06 PM Dr. Thomasena Edis is requesting that MRA head/neck and CXR be added to the w/u. Orders placed.    Final Clinical Impression(s) / ED Diagnoses Final diagnoses:  Memory loss    Rx / DC Orders ED Discharge Orders    None       Rayne Du 04/17/20 Ronni Rumble, MD 04/20/20 [phone]

## 2020-04-17 NOTE — H&P (Addendum)
Family Medicine Teaching [facility] Admission History and Physical Service Pager: [phone]  Patient name: Bradley Marquez Medical record number: [id number] Date of birth: 1956-01-25 Age: 64 y.o. Gender: male  Primary Care Provider: Havery Moros, MD Consultants: Neuro Code Status: FULL  Preferred Emergency Contact: Johnney Killian (wife)  Chief Complaint: Transient Memory Loss  Assessment and Plan: Bradley Marquez is a 64 y.o. male presenting with transient confusion/memory loss. PMH is significant for HLD, depression, psoriasis, and allergies.   Transient Memory Loss Patient presents with episode of confusion and memory loss, LKN at 7:30am, lasted about 1-2 hours and self resolved. Per patient, he carried out his normal routine, missed work meetings, and carried out conversations with family members without memory of this. Endorses 2/10 generalized headache but no other neurologic abnormalities. Denies any personal history of seizures, trauma, CNS infection, or substance use. He does have family history of absence seizures in his son. Of note, started doxycycline and prednisone yesterday for URI symptoms. Patient had similar episode 12 years ago that resolved spontaneously and per chart review was classified as transient global amnesia although no records in chart. In the ED, physical exam completely unremarkable including normal neuro exam. Basic labs including CBC and CMP obtained in the ED were within normal limits. Patient had CT head which was normal without acute abnormality. COVID/Flu negative. Differential includes: mostly likely transient global amnesia vs new seizure vs glucocorticoid induced psychosis vs thyroid abnormality vs less likely but possible TIA/CVA, substance use, trauma. -admit to FPTS, med-surg, attending Dr. Deirdre Priest -Regular diet -Neuro consulted, appreciate recommendations -Plan for EEG, MRI, MRA head and neck -CXR -Will obtain urine drug screen, Tylenol, Ethanol levels  and TSH  New Murmur II/VI systolic murmur best appreciated in RUSB on exam. Patient is a [employer] fund[employer] and has never been told he has a murmur. -Will obtain echo  HTN Patient has been hypertensive since arrival, with systolic BPs 148 to 180. No diagnosis of HTN and not on any home meds. -Continue to monitor BP -Consider initiation of anti-hypertensive medication if persistently elevated  HLD No records on file as patient is from [place], last lipid panel unknown. Home meds: Crestor 40mg  daily and Vascepa  -Continue home Crestor and Vascepa  Depression Stable, well-controlled. Takes 10mg  Celexa daily at home. -Continue home Celexa  FEN/GI: Regular diet Prophylaxis: Lovenox  Disposition: med-surg, likely d/c 10/1 pending work up  History of Present Illness:  Bradley Marquez is a 64 y.o. male presenting with transient memory loss and confusion. Symptoms started this morning around 7:30am. Wife reports he was normal when she left for work, but he subsequently developed confusion about why his son was going to work and patient missed his morning meeting. Patient does not recall ~1 hour time period. His symptoms resolved after approximately 1 hour and he has been at his baseline since that time. Patient endorses mild generalized headache, 2/10, which is still present but improved after a brief nap. Patient was not repeating himself, no slurred speech, no facial droop, arm weakness or other focal deficits.  Nothing unusual or different last night or this morning. Of note, started doxycycline and prednisone yesterday for congestion, productive cough and sinus pressure. Wife reports tick bite ~1 month ago on his back, did not develop rash at that time. No significant joint pain or stiffness. Also notes fall 1 month ago but did not hit head.  Patient has a history of similar episode 12-13 years ago. States it was "almost identical" but the prior  episode lasted slightly longer (2-3 hrs) before resolving  spontaneously.   No hx of seizures or syncope. Denies alcohol or drug use   Review Of Systems: Per HPI with the following additions:   Review of Systems  Constitutional: Negative for chills and fever.  HENT: Positive for rhinorrhea and sinus pressure.   Eyes: Negative for visual disturbance.  Respiratory: Positive for cough. Negative for shortness of breath.   Cardiovascular: Negative for chest pain.  Gastrointestinal: Negative for abdominal pain, nausea and vomiting.  Neurological: Positive for headaches. Negative for speech difficulty and weakness.  Psychiatric/Behavioral: Positive for confusion.     Past Medical History: Past Medical History:  Diagnosis Date   High cholesterol    Seasonal allergies     Past Surgical History: History reviewed. No pertinent surgical history.  Social History: Social History   Tobacco Use   Smoking status: Never Smoker  Substance Use Topics   Alcohol use: Not on file   Drug use: Not on file    Family History: Son with absence seizures Otherwise no pertinent family history  Allergies and Medications: Allergies  Allergen Reactions   Oxycodone Hives and Rash   Hydrocodone Hives and Rash   No current facility-administered medications on file prior to encounter.   Current Outpatient Medications on File Prior to Encounter  Medication Sig Dispense Refill   citalopram (CELEXA) 10 MG tablet Take 10 mg by mouth daily.     CRESTOR 40 MG tablet Take 40 mg by mouth daily.     doxycycline (VIBRAMYCIN) 100 MG capsule Take 100 mg by mouth daily.     ipratropium-albuterol (DUONEB) 0.5-2.5 (3) MG/3ML SOLN Take 3 mLs by nebulization 2 (two) times daily.     levocetirizine (XYZAL) 5 MG tablet Take 5 mg by mouth daily.     minocycline (MINOCIN) 100 MG capsule Take 100 mg by mouth 2 (two) times daily.     montelukast (SINGULAIR) 10 MG tablet Take 10 mg by mouth daily.     predniSONE (DELTASONE) 5 MG tablet Take by mouth.     tadalafil (CIALIS) 20  MG tablet Take by mouth.     VASCEPA 1 g capsule Take 2 g by mouth 2 (two) times daily.      Objective: BP (!) 153/95   Pulse 73   Temp 97.9 F (36.6 C) (Oral)   Resp 20   Ht 5\' 10"  (1.778 m)   Wt 86.2 kg   SpO2 96%   BMI 27.26 kg/m  Exam: General: alert, well-appearing, NAD, oriented x3 Eyes: PERRLA, EOMI Neck: supple Cardiovascular: RRR, normal S1/S2, II/VI systolic murmur at RUSB Respiratory: normal WOB on room air, lungs CTAB Gastrointestinal: +BS, soft, nontender, nondistended MSK: full ROM in b/l upper and lower extremities, 5/5 strength Derm: no rashes noted Neuro: CN II-VII intact, 5/5 strength in all four extremities, sensation intact throughout, no pronator drift, negative Romberg, normal finger-to-nose and heel-to-shin Psych: appropriate affect, normal speech  Labs and Imaging: CBC BMET  Recent Labs  Lab 04/17/20 1031  WBC 7.3  HGB 16.9  HCT 51.4  PLT 260   Recent Labs  Lab 04/17/20 1031  NA 140  K 3.8  CL 103  CO2 26  BUN 11  CREATININE 0.75  GLUCOSE 109*  CALCIUM 9.7      04/19/20, MD 04/17/2020, 6:02 PM PGY-1, [facility] FPTS Intern pager: [phone], text pages welcome  [phone], DO [facility], PGY3 04/17/2020 7:43 PM

## 2020-04-18 ENCOUNTER — Encounter (HOSPITAL_COMMUNITY): Payer: Self-pay | Admitting: Family Medicine

## 2020-04-18 ENCOUNTER — Observation Stay (HOSPITAL_BASED_OUTPATIENT_CLINIC_OR_DEPARTMENT_OTHER): Payer: No Typology Code available for payment source

## 2020-04-18 DIAGNOSIS — R011 Cardiac murmur, unspecified: Secondary | ICD-10-CM

## 2020-04-18 DIAGNOSIS — R413 Other amnesia: Secondary | ICD-10-CM | POA: Diagnosis not present

## 2020-04-18 DIAGNOSIS — E782 Mixed hyperlipidemia: Secondary | ICD-10-CM

## 2020-04-18 DIAGNOSIS — I10 Essential (primary) hypertension: Secondary | ICD-10-CM | POA: Diagnosis not present

## 2020-04-18 LAB — CBC
HCT: 46.9 % (ref 39.0–52.0)
Hemoglobin: 15.8 g/dL (ref 13.0–17.0)
MCH: 32.4 pg (ref 26.0–34.0)
MCHC: 33.7 g/dL (ref 30.0–36.0)
MCV: 96.3 fL (ref 80.0–100.0)
Platelets: 230 10*3/uL (ref 150–400)
RBC: 4.87 MIL/uL (ref 4.22–5.81)
RDW: 13.1 % (ref 11.5–15.5)
WBC: 7.9 10*3/uL (ref 4.0–10.5)
nRBC: 0 % (ref 0.0–0.2)

## 2020-04-18 LAB — COMPREHENSIVE METABOLIC PANEL
ALT: 34 U/L (ref 0–44)
AST: 24 U/L (ref 15–41)
Albumin: 3.7 g/dL (ref 3.5–5.0)
Alkaline Phosphatase: 86 U/L (ref 38–126)
Anion gap: 10 (ref 5–15)
BUN: 15 mg/dL (ref 8–23)
CO2: 25 mmol/L (ref 22–32)
Calcium: 9.3 mg/dL (ref 8.9–10.3)
Chloride: 105 mmol/L (ref 98–111)
Creatinine, Ser: 0.66 mg/dL (ref 0.61–1.24)
GFR calc Af Amer: >60 mL/min (ref >60)
GFR calc non Af Amer: >60 mL/min (ref >60)
Glucose, Bld: 95 mg/dL (ref 70–99)
Potassium: 3.8 mmol/L (ref 3.5–5.1)
Sodium: 140 mmol/L (ref 135–145)
Total Bilirubin: 1 mg/dL (ref 0.3–1.2)
Total Protein: 6.4 g/dL — ABNORMAL LOW (ref 6.5–8.1)

## 2020-04-18 LAB — HIV ANTIBODY (ROUTINE TESTING W REFLEX): HIV Screen 4th Generation wRfx: NONREACTIVE

## 2020-04-18 LAB — ECHOCARDIOGRAM COMPLETE
Area-P 1/2: 3.99 cm2
Height: 70 in
S' Lateral: 2.87 cm
Weight: 3040 oz

## 2020-04-18 MED ORDER — AMLODIPINE BESYLATE 5 MG PO TABS: 5.0000 mg | ORAL_TABLET | Freq: Every day | ORAL | 0 refills | Status: AC

## 2020-04-18 MED ORDER — ASPIRIN 81 MG PO TBEC: 81.0000 mg | DELAYED_RELEASE_TABLET | Freq: Every day | ORAL | 11 refills | Status: AC

## 2020-04-18 MED ORDER — AMLODIPINE BESYLATE 5 MG PO TABS
5.0000 mg | ORAL_TABLET | Freq: Every day | ORAL | Status: DC
  Administered 2020-04-18: 5 mg via ORAL
  Filled 2020-04-18: qty 1

## 2020-04-18 MED ORDER — INFLUENZA VAC SPLIT QUAD 0.5 ML IM SUSY
0.5000 mL | PREFILLED_SYRINGE | Freq: Once | INTRAMUSCULAR | Status: AC
  Administered 2020-04-18: 0.5 mL via INTRAMUSCULAR
  Filled 2020-04-18: qty 0.5

## 2020-04-18 NOTE — Progress Notes (Signed)
Family Medicine Teaching Service Daily Progress Note Intern Pager: [phone]  Patient name: Bradley Marquez Medical record number: [id number] Date of birth: Apr 01, 1956 Age: 64 y.o. Gender: male  Primary Care Provider: Havery Moros, MD Consultants: Neurology  Code Status: Full  Pt Overview and Major Events to Date:  Admitted 04/17/2020  Assessment and Plan:  Bradley Marquez is a 64 y.o. male presenting with transient confusion/memory loss. PMH is significant for HLD, depression, psoriasis, and allergies.  Transient Memory Loss Likely secondary secondary to transient global amnesia Basic labs including CBC and CMP obtained in the ED were within normal limits. Patient had CT head which was normal without acute abnormality. COVID/Flu negative. CT head w/o contrast demonstrates no evidence of acute intracranial abnormality. CXR showed mild left basilar linear scarring and/or atelectasis. MRA demonstrated no acute intracranial abnormality, negative head and neck MRA. UDS negative, normal salicylate and acetaminophen levels and TSH wnl. Less likely seizure given negative EEG and no trauma reported.  -Regular diet -Neuro consulted, appreciate recommendations -EEG demonstrated no abnormalities and no evidence of seizure-like activity.   New Murmur II/VI systolic murmur best appreciated in RUSB on exam. Patient is a [employer] and has never been told he has a murmur. -pending echo results  HTN Normotensive this morning. But patient has previously been hypertensive since arrival, with systolic BPs 148 to 180. No diagnosis of HTN and not on any home meds. -Continue to monitor BP -started on amlodipine 5 mg, follow up with PCP  HLD No records on file as patient is from [place], last lipid panel unknown. Home meds: Crestor 40mg  daily and Vascepa  -Continue home Crestor and Vascepa   Depression Stable, well-controlled. Takes 10mg  Celexa daily at home. -Continue home Celexa  FEN/GI: regular diet PPx:  Lovenox   Status is: Observation    Dispo: The patient is from: Home              Anticipated d/c is to: Home              Anticipated d/c date is: 1 day              Patient currently is medically stable to d/c.        Subjective:  Patient seen and examined at bedside. No overnight events. Patient clarifies that he did not actually start the doxycycline and prednisone that was prescribed by PCP a few days ago. Denies any stressors.   Objective: Temp:  [97.9 F (36.6 C)-98.2 F (36.8 C)] 98.2 F (36.8 C) (10/01 0424) Pulse Rate:  [60-80] 63 (10/01 0424) Resp:  [11-22] 16 (10/01 0424) BP: (134-180)/(78-112) 137/86 (10/01 0424) SpO2:  [94 %-100 %] 95 % (10/01 0424) Weight:  [86.2 kg] 86.2 kg (09/30 1016) Physical Exam: General:  Patient well-appearing, in no acute distress. Cardiovascular: RRR, 2/6 systolic murmur auscultated  Respiratory: lungs clear to auscultation bilaterally  Abdomen: soft, nontender, presence of active bowel sounds  Extremities: radial and distal pulses strong and equal bilaterally, no LE edema noted bilaterally Neuro: alert, conversational,  5/5 strength UE and LE bilaterally, gross sensation intact bilaterally, normal cerebellar testing, no evidence of dysdikinesia  Psych: mood appropriate  Laboratory: Recent Labs  Lab 04/17/20 1031 04/18/20 0235  WBC 7.3 7.9  HGB 16.9 15.8  HCT 51.4 46.9  PLT 260 230   Recent Labs  Lab 04/17/20 1031 04/18/20 0235  NA 140 140  K 3.8 3.8  CL 103 105  CO2 26 25  BUN 11 15  CREATININE  0.75 0.66  CALCIUM 9.7 9.3  PROT 7.5 6.4*  BILITOT 0.9 1.0  ALKPHOS 98 86  ALT 44 34  AST 33 24  GLUCOSE 109* 95      Imaging/Diagnostic Tests: EEG  Result Date: 04/18/2020 Bradley Marquez, Bradley O, MD     04/18/2020  9:05 AM Patient Name: Bradley Marquez MRN: [id number] Epilepsy Attending: Charlsie QuestPriyanka O Marquez Referring Physician/Provider: Leonia Coronaortni Couture, PA Date: 04/17/2020 Duration: 24.07 mins Patient history: 64yo M with  transient global amnesia. EEG to evaluate for seizure Level of alertness: Awake, asleep AEDs during EEG study: None Technical aspects: This EEG study was done with scalp electrodes positioned according to the 10-20 International system of electrode placement. Electrical activity was acquired at a sampling rate of 500Hz  and reviewed with a high frequency filter of 70Hz  and a low frequency filter of 1Hz . EEG data were recorded continuously and digitally stored. Description: The posterior dominant rhythm consists of 9-10 Hz activity of moderate voltage (25-35 uV) seen predominantly in posterior head regions, symmetric and reactive to eye opening and eye closing. Sleep was characterized by vertex waves, sleep spindles (12 to 14 Hz), maximal frontocentral region.    Hyperventilation and photic stimulation were not performed.   IMPRESSION: This study is within normal limits. No seizures or epileptiform discharges were seen throughout the recording. Bradley Questriyanka O Marquez   CT HEAD WO CONTRAST  Result Date: 04/17/2020 CLINICAL DATA:  TIA. EXAM: CT HEAD WITHOUT CONTRAST TECHNIQUE: Contiguous axial images were obtained from the base of the skull through the vertex without intravenous contrast. COMPARISON:  None. FINDINGS: Brain: No evidence of acute large vascular territory infarction, hemorrhage, hydrocephalus, extra-axial collection or mass lesion/mass effect. Vascular: No hyperdense vessel or unexpected calcification. Skull: Normal. Negative for fracture or focal lesion. Sinuses/Orbits: Partially imaged atretic left maxillary sinus with opacification. Mild paranasal sinus mucosal thickening. Other: No mastoid effusions. IMPRESSION: No evidence of acute intracranial abnormality. Electronically Signed   By: Bradley HartsFrederick S Jones MD   On: 04/17/2020 11:16   MR ANGIO HEAD WO CONTRAST  Result Date: 04/17/2020 CLINICAL DATA:  Transient memory loss. EXAM: MR HEAD WITHOUT CONTRAST MR CIRCLE OF WILLIS WITHOUT CONTRAST MRA OF THE  NECK WITHOUT AND WITH CONTRAST TECHNIQUE: Multiplanar, multiecho pulse sequences of the brain, circle of willis and surrounding structures were obtained without intravenous contrast. Angiographic images of the neck were obtained using MRA technique without and with intravenous contrast. CONTRAST:  8.456mL GADAVIST GADOBUTROL 1 MMOL/ML IV SOLN COMPARISON:  Head CT 04/17/2020 FINDINGS: MR HEAD FINDINGS Brain: There is no evidence of an acute infarct, intracranial hemorrhage, mass, midline shift, or extra-axial fluid collection. Mild prominence of the lateral and third ventricles may reflect mild central dominant cerebral atrophy. The brain is normal in signal. Vascular: Major intracranial vascular flow voids are preserved. Skull and upper cervical spine: Unremarkable bone marrow signal. Sinuses/Orbits: Unremarkable orbits. Complete opacification of the left maxillary sinus with mild mucosal thickening in other sinuses. Trace right mastoid effusion. Other: None. MR CIRCLE OF WILLIS FINDINGS The visualized portions of the vertebral arteries are widely patent to the basilar. Patent PICAs and SCAs are seen bilaterally although the right PICA origin was not included. The basilar artery is widely patent. There is a small right posterior communicating artery. The PCAs are patent without evidence of a significant proximal stenosis. The internal carotid arteries are widely patent from skull base to carotid termini. ACAs and MCAs are patent without evidence of a proximal branch occlusion or significant proximal stenosis. No aneurysm is  identified. MRA NECK FINDINGS There is a normal variant aortic arch branching pattern with the left vertebral artery arising directly from the arch, better demonstrated on a 2006 chest CTA as the left vertebral artery origin was incompletely imaged on today's MRA. The included portions of the subclavian and brachiocephalic arteries are widely patent. The common carotid and internal carotid arteries  are patent and smooth without evidence of a stenosis or dissection. The vertebral arteries are patent and codominant with antegrade flow bilaterally. There is no evidence of a vertebral artery dissection or stenosis. IMPRESSION: 1. No acute intracranial abnormality. 2. Negative head MRA. 3. Negative neck MRA. Electronically Signed   By: Sebastian Ache M.D.   On: 04/17/2020 21:17   MR Angiogram Neck W or Wo Contrast  Result Date: 04/17/2020 CLINICAL DATA:  Transient memory loss. EXAM: MR HEAD WITHOUT CONTRAST MR CIRCLE OF WILLIS WITHOUT CONTRAST MRA OF THE NECK WITHOUT AND WITH CONTRAST TECHNIQUE: Multiplanar, multiecho pulse sequences of the brain, circle of willis and surrounding structures were obtained without intravenous contrast. Angiographic images of the neck were obtained using MRA technique without and with intravenous contrast. CONTRAST:  8.57mL GADAVIST GADOBUTROL 1 MMOL/ML IV SOLN COMPARISON:  Head CT 04/17/2020 FINDINGS: MR HEAD FINDINGS Brain: There is no evidence of an acute infarct, intracranial hemorrhage, mass, midline shift, or extra-axial fluid collection. Mild prominence of the lateral and third ventricles may reflect mild central dominant cerebral atrophy. The brain is normal in signal. Vascular: Major intracranial vascular flow voids are preserved. Skull and upper cervical spine: Unremarkable bone marrow signal. Sinuses/Orbits: Unremarkable orbits. Complete opacification of the left maxillary sinus with mild mucosal thickening in other sinuses. Trace right mastoid effusion. Other: None. MR CIRCLE OF WILLIS FINDINGS The visualized portions of the vertebral arteries are widely patent to the basilar. Patent PICAs and SCAs are seen bilaterally although the right PICA origin was not included. The basilar artery is widely patent. There is a small right posterior communicating artery. The PCAs are patent without evidence of a significant proximal stenosis. The internal carotid arteries are widely  patent from skull base to carotid termini. ACAs and MCAs are patent without evidence of a proximal branch occlusion or significant proximal stenosis. No aneurysm is identified. MRA NECK FINDINGS There is a normal variant aortic arch branching pattern with the left vertebral artery arising directly from the arch, better demonstrated on a 2006 chest CTA as the left vertebral artery origin was incompletely imaged on today's MRA. The included portions of the subclavian and brachiocephalic arteries are widely patent. The common carotid and internal carotid arteries are patent and smooth without evidence of a stenosis or dissection. The vertebral arteries are patent and codominant with antegrade flow bilaterally. There is no evidence of a vertebral artery dissection or stenosis. IMPRESSION: 1. No acute intracranial abnormality. 2. Negative head MRA. 3. Negative neck MRA. Electronically Signed   By: Sebastian Ache M.D.   On: 04/17/2020 21:17   MR BRAIN WO CONTRAST  Result Date: 04/17/2020 CLINICAL DATA:  Transient memory loss. EXAM: MR HEAD WITHOUT CONTRAST MR CIRCLE OF WILLIS WITHOUT CONTRAST MRA OF THE NECK WITHOUT AND WITH CONTRAST TECHNIQUE: Multiplanar, multiecho pulse sequences of the brain, circle of willis and surrounding structures were obtained without intravenous contrast. Angiographic images of the neck were obtained using MRA technique without and with intravenous contrast. CONTRAST:  8.3mL GADAVIST GADOBUTROL 1 MMOL/ML IV SOLN COMPARISON:  Head CT 04/17/2020 FINDINGS: MR HEAD FINDINGS Brain: There is no evidence of an acute  infarct, intracranial hemorrhage, mass, midline shift, or extra-axial fluid collection. Mild prominence of the lateral and third ventricles may reflect mild central dominant cerebral atrophy. The brain is normal in signal. Vascular: Major intracranial vascular flow voids are preserved. Skull and upper cervical spine: Unremarkable bone marrow signal. Sinuses/Orbits: Unremarkable orbits.  Complete opacification of the left maxillary sinus with mild mucosal thickening in other sinuses. Trace right mastoid effusion. Other: None. MR CIRCLE OF WILLIS FINDINGS The visualized portions of the vertebral arteries are widely patent to the basilar. Patent PICAs and SCAs are seen bilaterally although the right PICA origin was not included. The basilar artery is widely patent. There is a small right posterior communicating artery. The PCAs are patent without evidence of a significant proximal stenosis. The internal carotid arteries are widely patent from skull base to carotid termini. ACAs and MCAs are patent without evidence of a proximal branch occlusion or significant proximal stenosis. No aneurysm is identified. MRA NECK FINDINGS There is a normal variant aortic arch branching pattern with the left vertebral artery arising directly from the arch, better demonstrated on a 2006 chest CTA as the left vertebral artery origin was incompletely imaged on today's MRA. The included portions of the subclavian and brachiocephalic arteries are widely patent. The common carotid and internal carotid arteries are patent and smooth without evidence of a stenosis or dissection. The vertebral arteries are patent and codominant with antegrade flow bilaterally. There is no evidence of a vertebral artery dissection or stenosis. IMPRESSION: 1. No acute intracranial abnormality. 2. Negative head MRA. 3. Negative neck MRA. Electronically Signed   By: Sebastian Ache M.D.   On: 04/17/2020 21:17   DG Chest Portable 1 View  Result Date: 04/17/2020 CLINICAL DATA:  Cough. EXAM: PORTABLE CHEST 1 VIEW COMPARISON:  March 23, 2005 FINDINGS: Mild linear scarring and/or atelectasis is seen within the lateral aspect of the left lung base. There is no evidence of acute infiltrate, pleural effusion or pneumothorax. The heart size and mediastinal contours are within normal limits. The visualized skeletal structures are unremarkable.  IMPRESSION: Mild left basilar linear scarring and/or atelectasis. Electronically Signed   By: Aram Candela M.D.   On: 04/17/2020 18:59    Reece Leader, DO 04/18/2020, 6:32 AM PGY-1, [facility] FPTS Intern pager: [phone], text pages welcome

## 2020-04-18 NOTE — TOC Transition Note (Signed)
Transition of Care [facility]) - CM/SW Discharge Note   Patient Details  Name: Bradley Marquez MRN: [id number] Date of Birth: October 17, 1955  [facility] CM/SW Contact:  Kermit Balo, RN Phone Number: 04/18/2020, 2:50 PM   Clinical Narrative:    Pt discharging home with self care. No needs per TOC.  Final next level of care: Home/Self Care Barriers to Discharge: No Barriers Identified   Patient Goals and CMS Choice        Discharge Placement                       Discharge Plan and Services                                     Social Determinants of Health (SDOH) Interventions     Readmission Risk Interventions No flowsheet data found.

## 2020-04-18 NOTE — Discharge Summary (Addendum)
[facility] Discharge Summary  Patient name: Bradley Marquez Medical record number: [id number] Date of birth: March 09, 1956 Age: 64 y.o. Gender: male Date of Admission: 04/17/2020  Date of Discharge: 04/18/2020 Admitting Physician: Joana Reamer, DO  Primary Care Provider: Havery Moros, MD Consultants: Neurology   Indication for Hospitalization: transient memory loss and confusion   Discharge Diagnoses/Problem List:  Transient global amnesia  Systolic murmur Hypertension Hyperlipidemia Depression   Disposition: home  Discharge Condition: medically stable   Discharge Exam:   Physical Exam: BP (!) 154/87 (BP Location: Left Arm)   Pulse 69   Temp 98.4 F (36.9 C) (Oral)   Resp 16   Ht 5\' 10"  (1.778 m)   Wt 86.2 kg   SpO2 96%   BMI 27.26 kg/m  General:  Patient well-appearing, in no acute distress. Cardiovascular: RRR, 2/6 systolic murmur auscultated  Respiratory: lungs clear to auscultation bilaterally  Abdomen: soft, nontender, presence of active bowel sounds  Extremities: radial and distal pulses strong and equal bilaterally, no LE edema noted bilaterally Neuro: alert, conversational,  5/5 strength UE and LE bilaterally, gross sensation intact bilaterally, normal cerebellar testing, no evidence of dysdikinesia  Psych: mood appropriate  Brief Hospital Course:  Gar Ryanis a 64 y.o.malepresenting with transient confusion/memory loss. PMH is significant forHLD, depression, psoriasis, and allergies.  Transient global amnesia Patient presented after having experiencing transient memory loss and confusion that lasted for about an hour before gradually resolving spontaneously. Concern for stroke and seizure, appropriate work up was completed for elimination of these conditions. Neurology consulted and continued to follow patient during hospitalization. EEG demonstrated no seizure-like activity. CT head w/o contrast demonstrates no evidence of  acute intracranial abnormality. CXR showed mild left basilar linear scarring and/or atelectasis. MRA demonstrated no acute intracranial abnormality, negative head and neck MRA. UDS negative, normal salicylate and acetaminophen levels and TSH wnl. Patient was monitored overnight, no further episodes occurred.   Newly diagnosed systolic murmur New 2/6 systolic murmur more prominent along the right upper sternal border incidentally found on admission. Patient asymptomatic, denied any chest pain throughout hospital stay. Echo performed which demonstrated EF of 70-75% along with mild to moderate aortic valve sclerosis/calfcification. Instructed to follow up with PCP to further assess if patient needs to be seen by cardiologist outpatient.    Hypertension Patient does not have a previous diagnosis of HTN and was not on any home anti-hypertensive medications. He had systolic blood pressures ranging between 148-180. Started on amlodipine 5 mg, instructed to follow up with PCP.  Health maintenance  Patient received influenza vaccination prior to discharge.   All other conditions remained chronic and stable throughout hospitalization.   Issues for Follow Up:  1. Assess for recurrence of memory concerns since discharge.  2. Follow up with PCP regarding starting new antihypertensive medication to reassess and make changes accordingly.  3. Follow up with PCP regarding systolic murmur to determine if cardiology follow up needed.  Significant Procedures:  Echo: EF 70-75%, no evidence of mitral regurgitation or stenosis. Mild to moderate aortic valve sclerosis/calcification without any evidence of aortic stenosis. Left atrial size was mildly dilated. EEG: no abnormalities and no evidence of seizure-like activity   Significant Labs and Imaging:  Recent Labs  Lab 04/17/20 1031 04/18/20 0235  WBC 7.3 7.9  HGB 16.9 15.8  HCT 51.4 46.9  PLT 260 230   Recent Labs  Lab 04/17/20 1031 04/18/20 0235  NA  140 140  K 3.8 3.8  CL  103 105  CO2 26 25  GLUCOSE 109* 95  BUN 11 15  CREATININE 0.75 0.66  CALCIUM 9.7 9.3  ALKPHOS 98 86  AST 33 24  ALT 44 34  ALBUMIN 4.5 3.7      Results/Tests Pending at Time of Discharge:  EEG  Result Date: 04/18/2020 Charlsie Quest, MD     04/18/2020  9:05 AM Patient Name: Bradley Marquez MRN: [id number] Epilepsy Attending: Charlsie Quest Referring Physician/Provider: Leonia Corona, PA Date: 04/17/2020 Duration: 24.07 mins Patient history: 64yo M with transient global amnesia. EEG to evaluate for seizure Level of alertness: Awake, asleep AEDs during EEG study: None Technical aspects: This EEG study was done with scalp electrodes positioned according to the 10-20 International system of electrode placement. Electrical activity was acquired at a sampling rate of 500Hz  and reviewed with a high frequency filter of 70Hz  and a low frequency filter of 1Hz . EEG data were recorded continuously and digitally stored. Description: The posterior dominant rhythm consists of 9-10 Hz activity of moderate voltage (25-35 uV) seen predominantly in posterior head regions, symmetric and reactive to eye opening and eye closing. Sleep was characterized by vertex waves, sleep spindles (12 to 14 Hz), maximal frontocentral region.    Hyperventilation and photic stimulation were not performed.   IMPRESSION: This study is within normal limits. No seizures or epileptiform discharges were seen throughout the recording.   MR ANGIO HEAD WO CONTRAST  Result Date: 04/17/2020 CLINICAL DATA:  Transient memory loss. EXAM: MR HEAD WITHOUT CONTRAST MR CIRCLE OF WILLIS WITHOUT CONTRAST MRA OF THE NECK WITHOUT AND WITH CONTRAST TECHNIQUE: Multiplanar, multiecho pulse sequences of the brain, circle of willis and surrounding structures were obtained without intravenous contrast. Angiographic images of the neck were obtained using MRA technique without and with intravenous contrast. CONTRAST:   8.50mL GADAVIST GADOBUTROL 1 MMOL/ML IV SOLN COMPARISON:  Head CT 04/17/2020 FINDINGS: MR HEAD FINDINGS Brain: There is no evidence of an acute infarct, intracranial hemorrhage, mass, midline shift, or extra-axial fluid collection. Mild prominence of the lateral and third ventricles may reflect mild central dominant cerebral atrophy. The brain is normal in signal. Vascular: Major intracranial vascular flow voids are preserved. Skull and upper cervical spine: Unremarkable bone marrow signal. Sinuses/Orbits: Unremarkable orbits. Complete opacification of the left maxillary sinus with mild mucosal thickening in other sinuses. Trace right mastoid effusion. Other: None. MR CIRCLE OF WILLIS FINDINGS The visualized portions of the vertebral arteries are widely patent to the basilar. Patent PICAs and SCAs are seen bilaterally although the right PICA origin was not included. The basilar artery is widely patent. There is a small right posterior communicating artery. The PCAs are patent without evidence of a significant proximal stenosis. The internal carotid arteries are widely patent from skull base to carotid termini. ACAs and MCAs are patent without evidence of a proximal branch occlusion or significant proximal stenosis. No aneurysm is identified. MRA NECK FINDINGS There is a normal variant aortic arch branching pattern with the left vertebral artery arising directly from the arch, better demonstrated on a 2006 chest CTA as the left vertebral artery origin was incompletely imaged on today's MRA. The included portions of the subclavian and brachiocephalic arteries are widely patent. The common carotid and internal carotid arteries are patent and smooth without evidence of a stenosis or dissection. The vertebral arteries are patent and codominant with antegrade flow bilaterally. There is no evidence of a vertebral artery dissection or stenosis. IMPRESSION: 1. No acute intracranial abnormality.  2. Negative head MRA. 3.  Negative neck MRA. Electronically Signed   By: Sebastian AcheAllen  Grady M.D.   On: 04/17/2020 21:17   MR Angiogram Neck W or Wo Contrast  Result Date: 04/17/2020 CLINICAL DATA:  Transient memory loss. EXAM: MR HEAD WITHOUT CONTRAST MR CIRCLE OF WILLIS WITHOUT CONTRAST MRA OF THE NECK WITHOUT AND WITH CONTRAST TECHNIQUE: Multiplanar, multiecho pulse sequences of the brain, circle of willis and surrounding structures were obtained without intravenous contrast. Angiographic images of the neck were obtained using MRA technique without and with intravenous contrast. CONTRAST:  8.486mL GADAVIST GADOBUTROL 1 MMOL/ML IV SOLN COMPARISON:  Head CT 04/17/2020 FINDINGS: MR HEAD FINDINGS Brain: There is no evidence of an acute infarct, intracranial hemorrhage, mass, midline shift, or extra-axial fluid collection. Mild prominence of the lateral and third ventricles may reflect mild central dominant cerebral atrophy. The brain is normal in signal. Vascular: Major intracranial vascular flow voids are preserved. Skull and upper cervical spine: Unremarkable bone marrow signal. Sinuses/Orbits: Unremarkable orbits. Complete opacification of the left maxillary sinus with mild mucosal thickening in other sinuses. Trace right mastoid effusion. Other: None. MR CIRCLE OF WILLIS FINDINGS The visualized portions of the vertebral arteries are widely patent to the basilar. Patent PICAs and SCAs are seen bilaterally although the right PICA origin was not included. The basilar artery is widely patent. There is a small right posterior communicating artery. The PCAs are patent without evidence of a significant proximal stenosis. The internal carotid arteries are widely patent from skull base to carotid termini. ACAs and MCAs are patent without evidence of a proximal branch occlusion or significant proximal stenosis. No aneurysm is identified. MRA NECK FINDINGS There is a normal variant aortic arch branching pattern with the left vertebral artery arising  directly from the arch, better demonstrated on a 2006 chest CTA as the left vertebral artery origin was incompletely imaged on today's MRA. The included portions of the subclavian and brachiocephalic arteries are widely patent. The common carotid and internal carotid arteries are patent and smooth without evidence of a stenosis or dissection. The vertebral arteries are patent and codominant with antegrade flow bilaterally. There is no evidence of a vertebral artery dissection or stenosis. IMPRESSION: 1. No acute intracranial abnormality. 2. Negative head MRA. 3. Negative neck MRA. Electronically Signed   By: Sebastian AcheAllen  Grady M.D.   On: 04/17/2020 21:17   MR BRAIN WO CONTRAST  Result Date: 04/17/2020 CLINICAL DATA:  Transient memory loss. EXAM: MR HEAD WITHOUT CONTRAST MR CIRCLE OF WILLIS WITHOUT CONTRAST MRA OF THE NECK WITHOUT AND WITH CONTRAST TECHNIQUE: Multiplanar, multiecho pulse sequences of the brain, circle of willis and surrounding structures were obtained without intravenous contrast. Angiographic images of the neck were obtained using MRA technique without and with intravenous contrast. CONTRAST:  8.616mL GADAVIST GADOBUTROL 1 MMOL/ML IV SOLN COMPARISON:  Head CT 04/17/2020 FINDINGS: MR HEAD FINDINGS Brain: There is no evidence of an acute infarct, intracranial hemorrhage, mass, midline shift, or extra-axial fluid collection. Mild prominence of the lateral and third ventricles may reflect mild central dominant cerebral atrophy. The brain is normal in signal. Vascular: Major intracranial vascular flow voids are preserved. Skull and upper cervical spine: Unremarkable bone marrow signal. Sinuses/Orbits: Unremarkable orbits. Complete opacification of the left maxillary sinus with mild mucosal thickening in other sinuses. Trace right mastoid effusion. Other: None. MR CIRCLE OF WILLIS FINDINGS The visualized portions of the vertebral arteries are widely patent to the basilar. Patent PICAs and SCAs are seen  bilaterally although the right  PICA origin was not included. The basilar artery is widely patent. There is a small right posterior communicating artery. The PCAs are patent without evidence of a significant proximal stenosis. The internal carotid arteries are widely patent from skull base to carotid termini. ACAs and MCAs are patent without evidence of a proximal branch occlusion or significant proximal stenosis. No aneurysm is identified. MRA NECK FINDINGS There is a normal variant aortic arch branching pattern with the left vertebral artery arising directly from the arch, better demonstrated on a 2006 chest CTA as the left vertebral artery origin was incompletely imaged on today's MRA. The included portions of the subclavian and brachiocephalic arteries are widely patent. The common carotid and internal carotid arteries are patent and smooth without evidence of a stenosis or dissection. The vertebral arteries are patent and codominant with antegrade flow bilaterally. There is no evidence of a vertebral artery dissection or stenosis. IMPRESSION: 1. No acute intracranial abnormality. 2. Negative head MRA. 3. Negative neck MRA. Electronically Signed   By: Sebastian Ache M.D.   On: 04/17/2020 21:17   DG Chest Portable 1 View  Result Date: 04/17/2020 CLINICAL DATA:  Cough. EXAM: PORTABLE CHEST 1 VIEW COMPARISON:  March 23, 2005 FINDINGS: Mild linear scarring and/or atelectasis is seen within the lateral aspect of the left lung base. There is no evidence of acute infiltrate, pleural effusion or pneumothorax. The heart size and mediastinal contours are within normal limits. The visualized skeletal structures are unremarkable. IMPRESSION: Mild left basilar linear scarring and/or atelectasis. Electronically Signed   By: Aram Candela M.D.   On: 04/17/2020 18:59   ECHOCARDIOGRAM COMPLETE  Result Date: 04/18/2020    ECHOCARDIOGRAM REPORT   Patient Name:   Bradley Marquez  Date of Exam: 04/18/2020 Medical Rec #:   [id number]  Height:       70.0 in Accession #:    [id number] Weight:       190.0 lb Date of Birth:  10/29/1955  BSA:          2.042 m Patient Age:    64 years   BP:           164/88 mmHg Patient Gender: M          HR:           72 bpm. Exam Location:  Inpatient Procedure: 2D Echo, Cardiac Doppler and Color Doppler Indications:    Murmur 785.2 / R01.1  History:        Patient has no prior history of Echocardiogram examinations.  Sonographer:    Eulah Pont RDCS Referring Phys: [id number] JOSEPH ZAMMIT IMPRESSIONS  1. Left ventricular ejection fraction, by estimation, is 70 to 75%. The left ventricle has hyperdynamic function. The left ventricle has no regional wall motion abnormalities. Left ventricular diastolic parameters were normal.  2. Right ventricular systolic function is normal. The right ventricular size is normal.  3. Left atrial size was mildly dilated.  4. The mitral valve is normal in structure. No evidence of mitral valve regurgitation. No evidence of mitral stenosis.  5. The aortic valve is tricuspid. Aortic valve regurgitation is not visualized. Mild to moderate aortic valve sclerosis/calcification is present, without any evidence of aortic stenosis.  6. The inferior vena cava is normal in size with greater than 50% respiratory variability, suggesting right atrial pressure of 3 mmHg. FINDINGS  Left Ventricle: Left ventricular ejection fraction, by estimation, is 70 to 75%. The left ventricle has hyperdynamic function. The left ventricle has no regional wall  motion abnormalities. The left ventricular internal cavity size was normal in size. There is no left ventricular hypertrophy. Left ventricular diastolic parameters were normal. Right Ventricle: The right ventricular size is normal.Right ventricular systolic function is normal. Left Atrium: Left atrial size was mildly dilated. Right Atrium: Right atrial size was normal in size. Pericardium: There is no evidence of pericardial effusion. Mitral Valve: The  mitral valve is normal in structure. Mild mitral annular calcification. No evidence of mitral valve regurgitation. No evidence of mitral valve stenosis. Tricuspid Valve: The tricuspid valve is normal in structure. Tricuspid valve regurgitation is trivial. No evidence of tricuspid stenosis. Aortic Valve: The aortic valve is tricuspid. Aortic valve regurgitation is not visualized. Mild to moderate aortic valve sclerosis/calcification is present, without any evidence of aortic stenosis. Pulmonic Valve: The pulmonic valve was normal in structure. Pulmonic valve regurgitation is not visualized. No evidence of pulmonic stenosis. Aorta: The aortic root is normal in size and structure. Venous: The inferior vena cava is normal in size with greater than 50% respiratory variability, suggesting right atrial pressure of 3 mmHg. IAS/Shunts: No atrial level shunt detected by color flow Doppler.  LEFT VENTRICLE PLAX 2D LVIDd:         4.17 cm  Diastology LVIDs:         2.87 cm  LV e' medial:    8.16 cm/s LV PW:         1.12 cm  LV E/e' medial:  9.7 LV IVS:        1.10 cm  LV e' lateral:   10.20 cm/s LVOT diam:     2.10 cm  LV E/e' lateral: 7.8 LV SV:         81 LV SV Index:   40 LVOT Area:     3.46 cm  RIGHT VENTRICLE RV S prime:     9.46 cm/s TAPSE (M-mode): 2.1 cm LEFT ATRIUM             Index       RIGHT ATRIUM           Index LA diam:        4.00 cm 1.96 cm/m  RA Area:     10.90 cm LA Vol (A2C):   60.2 ml 29.48 ml/m RA Volume:   19.50 ml  9.55 ml/m LA Vol (A4C):   73.2 ml 35.84 ml/m LA Biplane Vol: 70.8 ml 34.67 ml/m  AORTIC VALVE LVOT Vmax:   108.00 cm/s LVOT Vmean:  81.900 cm/s LVOT VTI:    0.233 m  AORTA Ao Root diam: 3.10 cm Ao Asc diam:  3.20 cm MITRAL VALVE MV Area (PHT): 3.99 cm    SHUNTS MV Decel Time: 190 msec    Systemic VTI:  0.23 m MV E velocity: 79.50 cm/s  Systemic Diam: 2.10 cm MV A velocity: 88.30 cm/s MV E/A ratio:  0.90 Olga Millers MD Electronically signed by Olga Millers MD Signature Date/Time:  04/18/2020/12:07:03 PM    Final     Discharge Medications:  Allergies as of 04/18/2020      Reactions   Oxycodone Hives, Rash   Hydrocodone Hives, Rash      Medication List    STOP taking these medications   doxycycline 100 MG capsule Commonly known as: VIBRAMYCIN   predniSONE 5 MG tablet Commonly known as: DELTASONE   tadalafil 20 MG tablet Commonly known as: CIALIS     TAKE these medications   amLODipine 5 MG tablet Commonly known as: NORVASC Take  1 tablet (5 mg total) by mouth daily.   aspirin 81 MG EC tablet Take 1 tablet (81 mg total) by mouth daily. Swallow whole. Start taking on: April 19, 2020   citalopram 10 MG tablet Commonly known as: CELEXA Take 10 mg by mouth daily.   Crestor 40 MG tablet Generic drug: rosuvastatin Take 40 mg by mouth daily.   hydroxychloroquine 200 MG tablet Commonly known as: PLAQUENIL Take 200 mg by mouth daily.   ipratropium-albuterol 0.5-2.5 (3) MG/3ML Soln Commonly known as: DUONEB Take 3 mLs by nebulization 2 (two) times daily.   levocetirizine 5 MG tablet Commonly known as: XYZAL Take 5 mg by mouth at bedtime.   montelukast 10 MG tablet Commonly known as: SINGULAIR Take 10 mg by mouth daily.   Vascepa 1 g capsule Generic drug: icosapent Ethyl Take 2 g by mouth 2 (two) times daily.   VITAMIN B 12 PO Take 1 tablet by mouth daily.   VITAMIN C PO Take 1 capsule by mouth daily.   VITAMIN D PO Take 1 capsule by mouth daily.   ZINC PO Take 1 tablet by mouth daily.       Discharge Instructions: Please refer to Patient Instructions section of EMR for full details.  Patient was counseled important signs and symptoms that should prompt return to medical care, changes in medications, dietary instructions, activity restrictions, and follow up appointments.   Follow-Up Appointments:  Follow-up Information    Havery Moros, MD Follow up.   Specialty: Pulmonary Disease Why: Please go to your next scheduled  appointment. Contact information: [address] [place] [address] [phone]               Reece Leader, DO 04/18/2020, 3:38 PM PGY-1, [facility]

## 2020-04-18 NOTE — Progress Notes (Signed)
Patient at this time has no complaints and has returned back to baseline.  Neuro exam shows no deficits. MRI head is negative EEG--is within normal limits. No seizures or epileptiform discharges were seen throughout the recording. MRA head is negative.   At this time most likely TGA or possibly stress induced.   No further testing or recommendations.   Felicie Morn PA-C [facility] [phone]  M-F  (9:00 am- 5:00 PM)  04/18/2020, 12:14 PM

## 2020-04-18 NOTE — Progress Notes (Signed)
Brief Neurology Follow up Note  No new complaints. Patient is back to his baseline.   Reviewed MRI brain and MRA H&N which showed MRI brain - No acute intracranial abnormality. MRA - Negative head/Negative neck.  Discussed results of studies and as the patient has had a similar episode many years ago recommended following up with previous neurologist as they would be familiar with the case.   Marisue Humble, MD Page: [phone]

## 2020-04-18 NOTE — Discharge Instructions (Signed)
You were admitted for transient memory loss/confusion with a diagnosis of transient global amnesia. Since this is a diagnosis based on exclusion, we wanted to rule out other possible conditions such as a seizure and stroke. Stroke workup including pertinent imaging was all negative. An EEG was performed to observe the brain's electrical activity which did not demonstrated any seizure-like activity. We started you on a blood pressure medication called amlodipine 5mg , please take this daily. Please see you PCP for follow up. Thank you for allowing to be a part of your medical care!

## 2020-04-18 NOTE — Progress Notes (Signed)
Pt escorted out of the building on foot with wife and belongings by his side.

## 2020-04-18 NOTE — Procedures (Signed)
Patient Name: Bradley Marquez  MRN: [id number]  Epilepsy Attending: Charlsie Quest  Referring Physician/Provider: Leonia Corona, PA Date: 04/17/2020 Duration: 24.07 mins  Patient history: 64yo M with transient global amnesia. EEG to evaluate for seizure  Level of alertness: Awake, asleep  AEDs during EEG study: None  Technical aspects: This EEG study was done with scalp electrodes positioned according to the 10-20 International system of electrode placement. Electrical activity was acquired at a sampling rate of 500Hz  and reviewed with a high frequency filter of 70Hz  and a low frequency filter of 1Hz . EEG data were recorded continuously and digitally stored.   Description: The posterior dominant rhythm consists of 9-10 Hz activity of moderate voltage (25-35 uV) seen predominantly in posterior head regions, symmetric and reactive to eye opening and eye closing. Sleep was characterized by vertex waves, sleep spindles (12 to 14 Hz), maximal frontocentral region.    Hyperventilation and photic stimulation were not performed.     IMPRESSION: This study is within normal limits. No seizures or epileptiform discharges were seen throughout the recording.  Angelynn Lemus 

## 2020-04-18 NOTE — Progress Notes (Signed)
  Echocardiogram 2D Echocardiogram has been performed.  Augustine Radar 04/18/2020, 9:46 AM
